# Patient Record
Sex: Female | Born: 1955
Health system: Southern US, Community
[De-identification: ages and names within clinical notes are randomized; demographics above are authoritative.]

## PROBLEM LIST (undated history)

## (undated) DIAGNOSIS — R259 Unspecified abnormal involuntary movements: Secondary | ICD-10-CM

## (undated) DIAGNOSIS — Q625 Duplication of ureter: Secondary | ICD-10-CM

## (undated) DIAGNOSIS — D649 Anemia, unspecified: Secondary | ICD-10-CM

## (undated) DIAGNOSIS — I480 Paroxysmal atrial fibrillation: Secondary | ICD-10-CM

## (undated) DIAGNOSIS — R0789 Other chest pain: Secondary | ICD-10-CM

## (undated) DIAGNOSIS — K449 Diaphragmatic hernia without obstruction or gangrene: Secondary | ICD-10-CM

## (undated) DIAGNOSIS — N200 Calculus of kidney: Secondary | ICD-10-CM

## (undated) DIAGNOSIS — I10 Essential (primary) hypertension: Secondary | ICD-10-CM

## (undated) DIAGNOSIS — Z9889 Other specified postprocedural states: Secondary | ICD-10-CM

## (undated) DIAGNOSIS — K8050 Calculus of bile duct without cholangitis or cholecystitis without obstruction: Secondary | ICD-10-CM

## (undated) DIAGNOSIS — R1310 Dysphagia, unspecified: Secondary | ICD-10-CM

## (undated) DIAGNOSIS — F411 Generalized anxiety disorder: Secondary | ICD-10-CM

## (undated) DIAGNOSIS — C801 Malignant (primary) neoplasm, unspecified: Secondary | ICD-10-CM

## (undated) HISTORY — DX: Dysphagia, unspecified: R13.10

## (undated) HISTORY — DX: Unspecified abnormal involuntary movements: R25.9

## (undated) HISTORY — DX: Anemia, unspecified: D64.9

## (undated) HISTORY — DX: Malignant (primary) neoplasm, unspecified: C80.1

## (undated) HISTORY — DX: Generalized anxiety disorder: F41.1

## (undated) HISTORY — DX: Diaphragmatic hernia without obstruction or gangrene: K44.9

## (undated) HISTORY — DX: Other chest pain: R07.89

## (undated) HISTORY — DX: Calculus of kidney: N20.0

## (undated) HISTORY — DX: Essential (primary) hypertension: I10

## (undated) HISTORY — DX: Calculus of bile duct without cholangitis or cholecystitis without obstruction: K80.50

## (undated) HISTORY — DX: Paroxysmal atrial fibrillation: I48.0

## (undated) HISTORY — DX: Duplication of ureter: Q62.5

---

## 1998-11-15 ENCOUNTER — Encounter: Admission: RE | Admit: 1998-11-15 | Discharge: 1998-12-13 | Payer: Self-pay | Admitting: Family Medicine

## 1998-11-29 ENCOUNTER — Other Ambulatory Visit: Admission: RE | Admit: 1998-11-29 | Discharge: 1998-11-29 | Payer: Self-pay | Admitting: Radiology

## 1999-07-31 ENCOUNTER — Other Ambulatory Visit: Admission: RE | Admit: 1999-07-31 | Discharge: 1999-07-31 | Payer: Self-pay | Admitting: Obstetrics and Gynecology

## 2000-07-16 ENCOUNTER — Ambulatory Visit (HOSPITAL_COMMUNITY): Admission: RE | Admit: 2000-07-16 | Discharge: 2000-07-16 | Payer: Self-pay | Admitting: Family Medicine

## 2000-07-16 ENCOUNTER — Encounter: Payer: Self-pay | Admitting: Family Medicine

## 2000-10-17 ENCOUNTER — Other Ambulatory Visit: Admission: RE | Admit: 2000-10-17 | Discharge: 2000-10-17 | Payer: Self-pay | Admitting: Obstetrics and Gynecology

## 2001-09-03 DIAGNOSIS — N200 Calculus of kidney: Secondary | ICD-10-CM

## 2001-09-03 HISTORY — DX: Calculus of kidney: N20.0

## 2001-09-03 HISTORY — PX: CYSTOSCOPY/RETROGRADE/URETEROSCOPY/STONE EXTRACTION WITH BASKET: SHX5317

## 2001-12-09 ENCOUNTER — Other Ambulatory Visit: Admission: RE | Admit: 2001-12-09 | Discharge: 2001-12-09 | Payer: Self-pay | Admitting: Obstetrics and Gynecology

## 2002-01-14 ENCOUNTER — Ambulatory Visit (HOSPITAL_COMMUNITY): Admission: EM | Admit: 2002-01-14 | Discharge: 2002-01-14 | Payer: Self-pay | Admitting: Emergency Medicine

## 2002-01-18 ENCOUNTER — Emergency Department (HOSPITAL_COMMUNITY): Admission: EM | Admit: 2002-01-18 | Discharge: 2002-01-19 | Payer: Self-pay | Admitting: Emergency Medicine

## 2003-02-10 ENCOUNTER — Other Ambulatory Visit: Admission: RE | Admit: 2003-02-10 | Discharge: 2003-02-10 | Payer: Self-pay | Admitting: Obstetrics and Gynecology

## 2003-09-04 HISTORY — PX: SQUAMOUS CELL CARCINOMA EXCISION: SHX2433

## 2003-11-30 ENCOUNTER — Ambulatory Visit (HOSPITAL_COMMUNITY): Admission: RE | Admit: 2003-11-30 | Discharge: 2003-11-30 | Payer: Self-pay | Admitting: Plastic Surgery

## 2003-11-30 ENCOUNTER — Ambulatory Visit (HOSPITAL_BASED_OUTPATIENT_CLINIC_OR_DEPARTMENT_OTHER): Admission: RE | Admit: 2003-11-30 | Discharge: 2003-11-30 | Payer: Self-pay | Admitting: Plastic Surgery

## 2003-11-30 ENCOUNTER — Encounter (INDEPENDENT_AMBULATORY_CARE_PROVIDER_SITE_OTHER): Payer: Self-pay | Admitting: *Deleted

## 2004-03-14 ENCOUNTER — Ambulatory Visit (HOSPITAL_COMMUNITY): Admission: RE | Admit: 2004-03-14 | Discharge: 2004-03-14 | Payer: Self-pay | Admitting: Family Medicine

## 2004-04-07 ENCOUNTER — Other Ambulatory Visit: Admission: RE | Admit: 2004-04-07 | Discharge: 2004-04-07 | Payer: Self-pay | Admitting: Obstetrics and Gynecology

## 2005-04-24 ENCOUNTER — Ambulatory Visit (HOSPITAL_COMMUNITY): Admission: RE | Admit: 2005-04-24 | Discharge: 2005-04-24 | Payer: Self-pay | Admitting: Family Medicine

## 2005-08-15 ENCOUNTER — Encounter: Admission: RE | Admit: 2005-08-15 | Discharge: 2005-08-15 | Payer: Self-pay | Admitting: Family Medicine

## 2005-08-30 ENCOUNTER — Encounter: Admission: RE | Admit: 2005-08-30 | Discharge: 2005-08-30 | Payer: Self-pay | Admitting: Family Medicine

## 2005-09-18 ENCOUNTER — Encounter: Admission: RE | Admit: 2005-09-18 | Discharge: 2005-09-18 | Payer: Self-pay | Admitting: Family Medicine

## 2005-10-01 ENCOUNTER — Encounter: Admission: RE | Admit: 2005-10-01 | Discharge: 2005-10-01 | Payer: Self-pay | Admitting: Family Medicine

## 2006-01-18 ENCOUNTER — Other Ambulatory Visit: Admission: RE | Admit: 2006-01-18 | Discharge: 2006-01-18 | Payer: Self-pay | Admitting: Family Medicine

## 2006-01-18 ENCOUNTER — Encounter: Admission: RE | Admit: 2006-01-18 | Discharge: 2006-01-18 | Payer: Self-pay | Admitting: Family Medicine

## 2006-02-18 ENCOUNTER — Encounter: Admission: RE | Admit: 2006-02-18 | Discharge: 2006-02-18 | Payer: Self-pay | Admitting: Family Medicine

## 2006-04-19 ENCOUNTER — Ambulatory Visit: Payer: Self-pay | Admitting: Gastroenterology

## 2006-05-02 ENCOUNTER — Ambulatory Visit: Payer: Self-pay | Admitting: Gastroenterology

## 2006-08-28 ENCOUNTER — Encounter: Admission: RE | Admit: 2006-08-28 | Discharge: 2006-08-28 | Payer: Self-pay | Admitting: Family Medicine

## 2006-09-05 ENCOUNTER — Encounter: Admission: RE | Admit: 2006-09-05 | Discharge: 2006-09-05 | Payer: Self-pay | Admitting: Family Medicine

## 2007-02-25 ENCOUNTER — Ambulatory Visit (HOSPITAL_BASED_OUTPATIENT_CLINIC_OR_DEPARTMENT_OTHER): Admission: RE | Admit: 2007-02-25 | Discharge: 2007-02-25 | Payer: Self-pay | Admitting: Plastic Surgery

## 2007-02-25 ENCOUNTER — Encounter (INDEPENDENT_AMBULATORY_CARE_PROVIDER_SITE_OTHER): Payer: Self-pay | Admitting: Plastic Surgery

## 2007-03-27 ENCOUNTER — Other Ambulatory Visit: Admission: RE | Admit: 2007-03-27 | Discharge: 2007-03-27 | Payer: Self-pay | Admitting: Family Medicine

## 2007-03-29 ENCOUNTER — Emergency Department (HOSPITAL_COMMUNITY): Admission: EM | Admit: 2007-03-29 | Discharge: 2007-03-29 | Payer: Self-pay | Admitting: Emergency Medicine

## 2007-04-03 ENCOUNTER — Ambulatory Visit: Payer: Self-pay

## 2007-04-04 ENCOUNTER — Encounter: Admission: RE | Admit: 2007-04-04 | Discharge: 2007-04-04 | Payer: Self-pay | Admitting: Family Medicine

## 2007-06-06 ENCOUNTER — Emergency Department (HOSPITAL_COMMUNITY): Admission: EM | Admit: 2007-06-06 | Discharge: 2007-06-07 | Payer: Self-pay | Admitting: Emergency Medicine

## 2007-10-06 ENCOUNTER — Encounter: Admission: RE | Admit: 2007-10-06 | Discharge: 2007-10-06 | Payer: Self-pay | Admitting: Family Medicine

## 2007-12-05 ENCOUNTER — Encounter: Admission: RE | Admit: 2007-12-05 | Discharge: 2007-12-05 | Payer: Self-pay | Admitting: Family Medicine

## 2008-09-03 DIAGNOSIS — K449 Diaphragmatic hernia without obstruction or gangrene: Secondary | ICD-10-CM

## 2008-09-03 DIAGNOSIS — K8050 Calculus of bile duct without cholangitis or cholecystitis without obstruction: Secondary | ICD-10-CM

## 2008-09-03 HISTORY — PX: LAPAROSCOPIC PARAESOPHAGEAL HERNIA REPAIR: SHX6307

## 2008-09-03 HISTORY — PX: LAPAROSCOPIC NISSEN FUNDOPLICATION: SHX1932

## 2008-09-03 HISTORY — DX: Diaphragmatic hernia without obstruction or gangrene: K44.9

## 2008-09-03 HISTORY — PX: LAPAROSCOPIC CHOLECYSTECTOMY W/ CHOLANGIOGRAPHY: SUR757

## 2008-09-03 HISTORY — DX: Calculus of bile duct without cholangitis or cholecystitis without obstruction: K80.50

## 2008-10-25 ENCOUNTER — Encounter: Admission: RE | Admit: 2008-10-25 | Discharge: 2008-10-25 | Payer: Self-pay | Admitting: Family Medicine

## 2008-11-30 ENCOUNTER — Other Ambulatory Visit: Admission: RE | Admit: 2008-11-30 | Discharge: 2008-11-30 | Payer: Self-pay | Admitting: Family Medicine

## 2009-02-01 ENCOUNTER — Emergency Department (HOSPITAL_COMMUNITY): Admission: EM | Admit: 2009-02-01 | Discharge: 2009-02-02 | Payer: Self-pay | Admitting: Emergency Medicine

## 2009-02-23 ENCOUNTER — Ambulatory Visit (HOSPITAL_COMMUNITY): Admission: RE | Admit: 2009-02-23 | Discharge: 2009-02-23 | Payer: Self-pay | Admitting: Surgery

## 2009-03-10 ENCOUNTER — Encounter (INDEPENDENT_AMBULATORY_CARE_PROVIDER_SITE_OTHER): Payer: Self-pay | Admitting: Surgery

## 2009-03-12 ENCOUNTER — Inpatient Hospital Stay (HOSPITAL_COMMUNITY): Admission: AD | Admit: 2009-03-12 | Discharge: 2009-03-26 | Payer: Self-pay | Admitting: Surgery

## 2009-04-15 ENCOUNTER — Ambulatory Visit (HOSPITAL_COMMUNITY): Admission: RE | Admit: 2009-04-15 | Discharge: 2009-04-15 | Payer: Self-pay | Admitting: Gastroenterology

## 2009-04-22 ENCOUNTER — Encounter: Admission: RE | Admit: 2009-04-22 | Discharge: 2009-04-22 | Payer: Self-pay | Admitting: Surgery

## 2009-05-11 ENCOUNTER — Encounter: Admission: RE | Admit: 2009-05-11 | Discharge: 2009-05-11 | Payer: Self-pay | Admitting: Surgery

## 2009-06-13 ENCOUNTER — Encounter: Admission: RE | Admit: 2009-06-13 | Discharge: 2009-06-13 | Payer: Self-pay | Admitting: Surgery

## 2009-09-28 ENCOUNTER — Encounter: Admission: RE | Admit: 2009-09-28 | Discharge: 2009-09-28 | Payer: Self-pay | Admitting: Gastroenterology

## 2010-01-31 ENCOUNTER — Encounter: Admission: RE | Admit: 2010-01-31 | Discharge: 2010-01-31 | Payer: Self-pay | Admitting: Gastroenterology

## 2010-05-09 IMAGING — CT CT ABDOMEN W/ CM
2 of 5 series · 16 of 46 positions shown, 18 images · IV contrast (omnipaque)
Comparison: Plain films of [DATE], esophagram 03/11/2009

CT CHEST

CLINICAL DATA: Paraesophageal hiatal hernia, laparoscopic
cholecystectomy and hernia repair,  persistent ileus.  Rule out
small bowel obstruction or abscess.

CT CHEST, ABDOMEN AND PELVIS WITH CONTRAST
TECHNIQUE: Multidetector CT imaging of the chest, abdomen and
pelvis was performed following the standard protocol during bolus
administration of intravenous contrast.
Contrast: 100 ml Omnipaque 300

[Series 2: cap with st · axial · 0.73mm/px · z∈[-800,-204]mm · 13 of 134 slices shown, 15 images]
[im 8/134  soft-tissue]
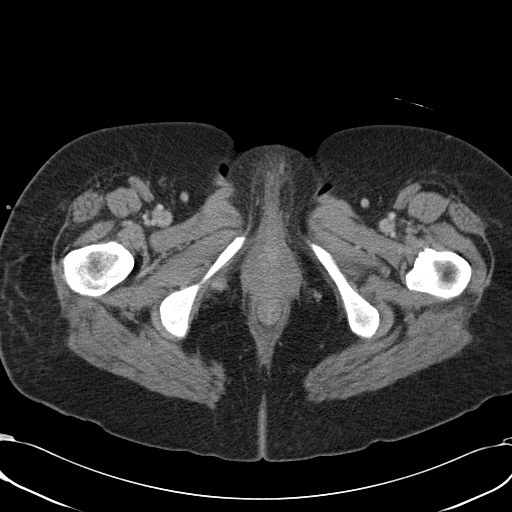
[im 8/134  bone]
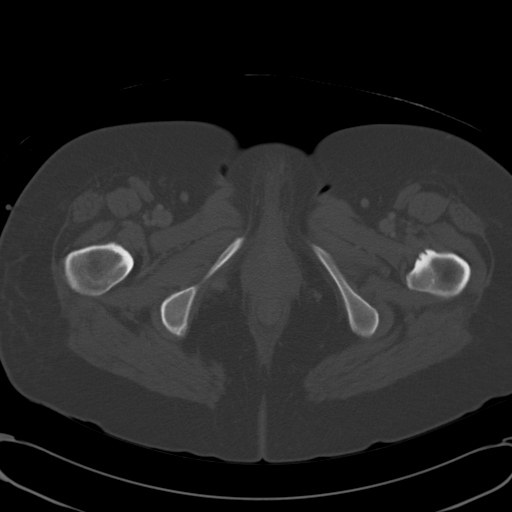
[im 22/134  soft-tissue]
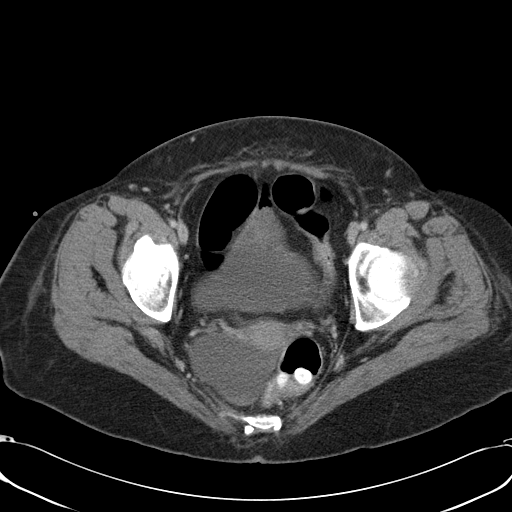
[im 29/134  soft-tissue]
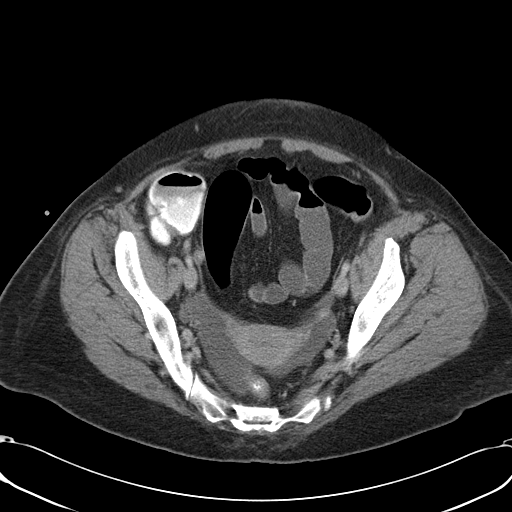
[im 36/134  soft-tissue]
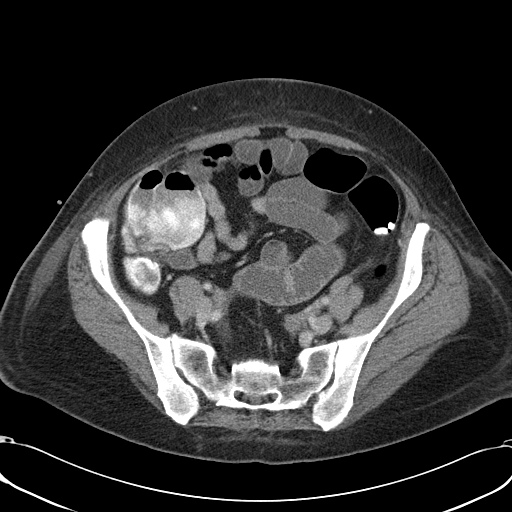
[im 50/134  soft-tissue]
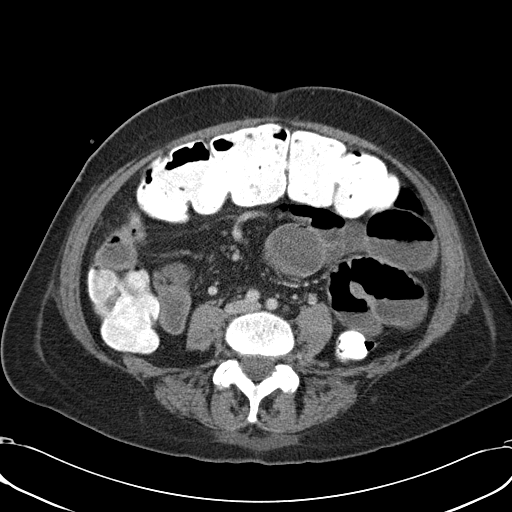
[im 57/134  soft-tissue]
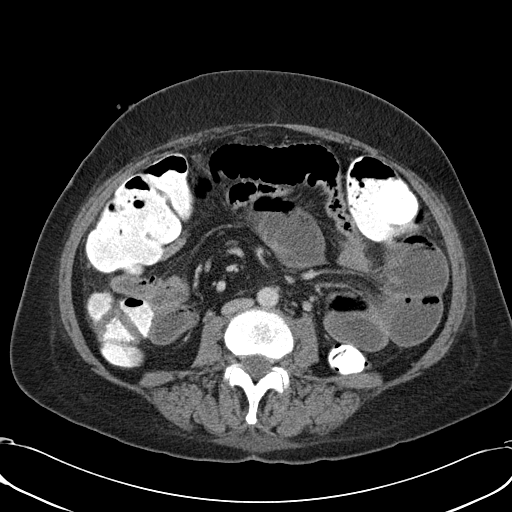
[im 71/134  soft-tissue]
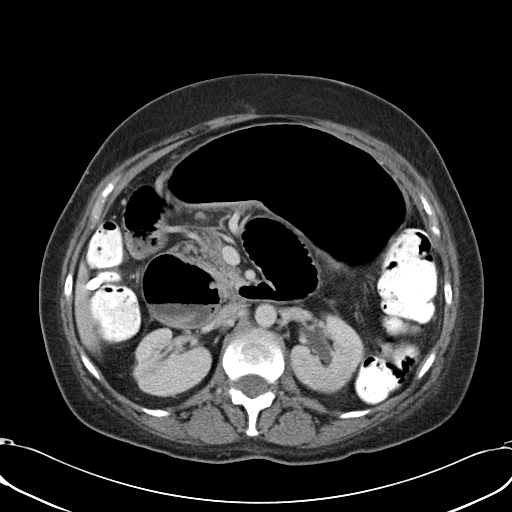
[im 78/134  soft-tissue]
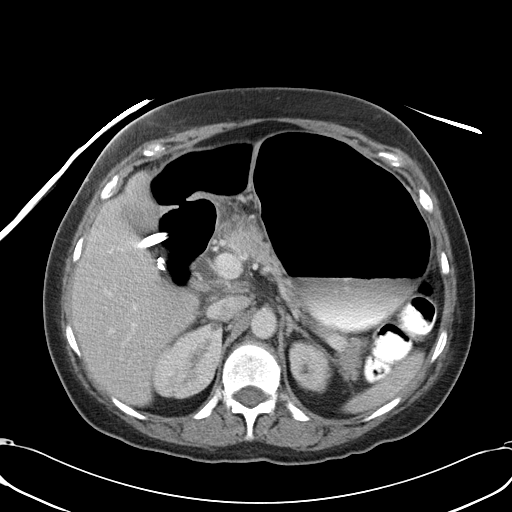
[im 85/134  soft-tissue]
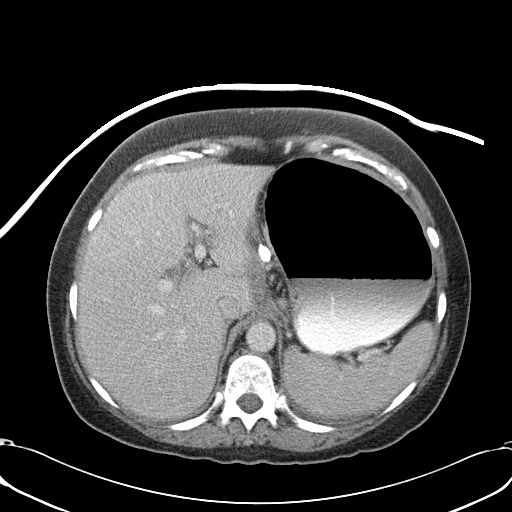
[im 85/134  bone]
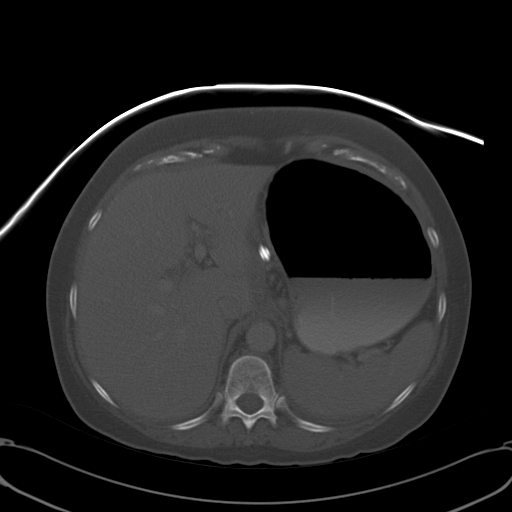
[im 99/134  soft-tissue]
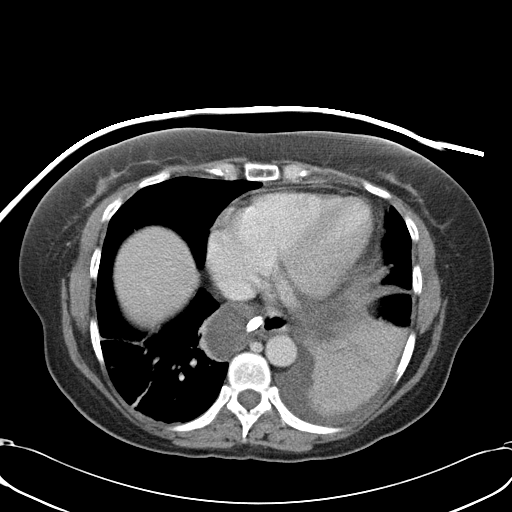
[im 106/134  soft-tissue]
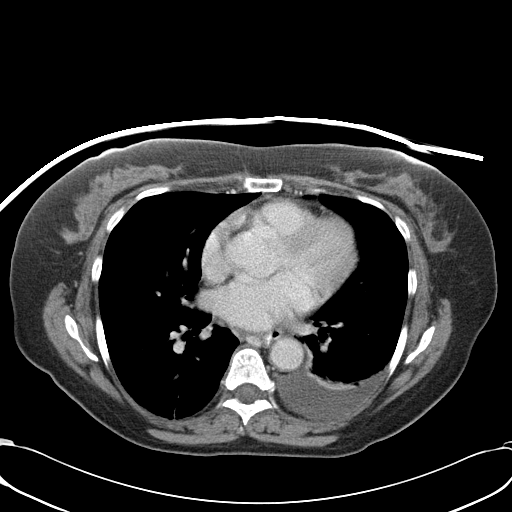
[im 113/134  soft-tissue]
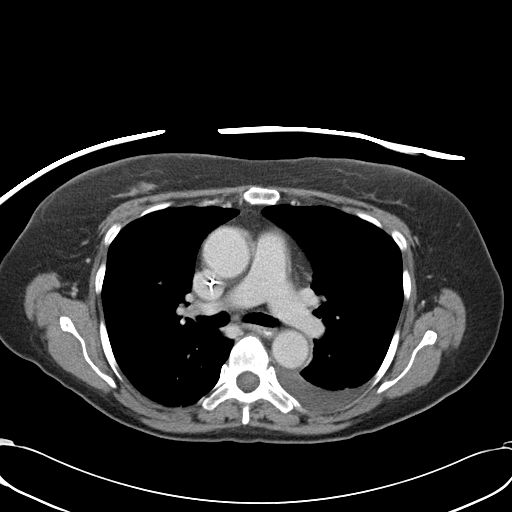
[im 127/134  soft-tissue]
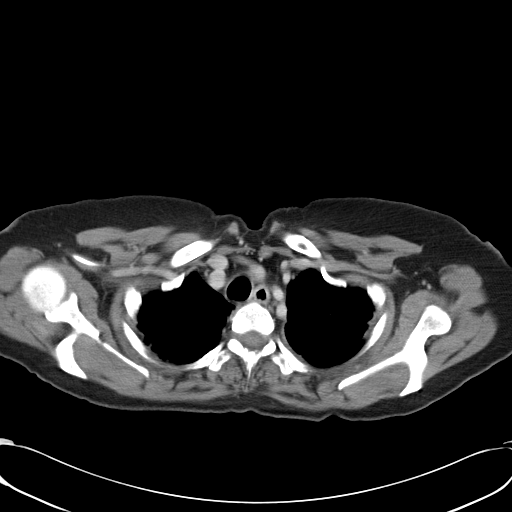

[Series 602: <mpr thick range> · coronal · 1.30mm/px · 3 of 88 slices shown]
[im 30/88  soft-tissue]
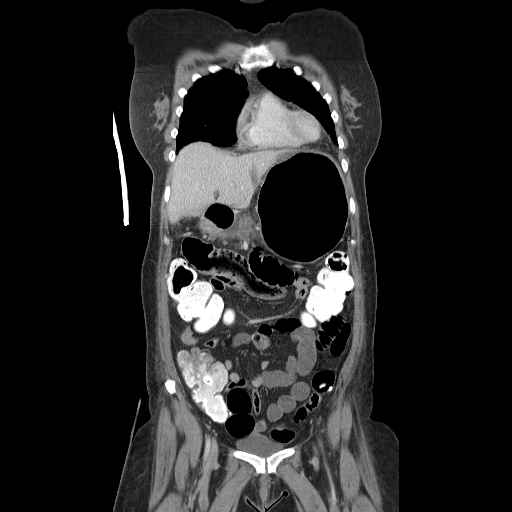
[im 39/88  soft-tissue]
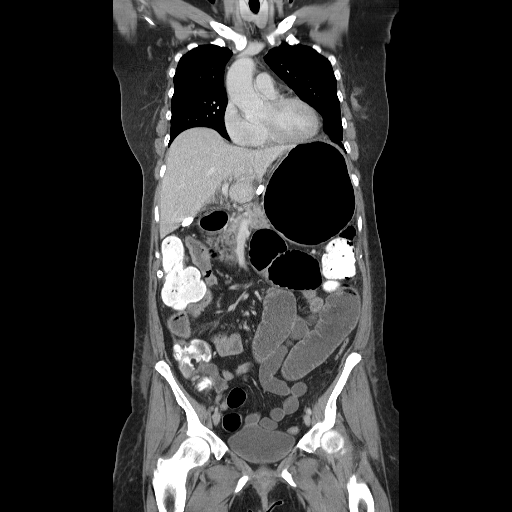
[im 49/88  soft-tissue]
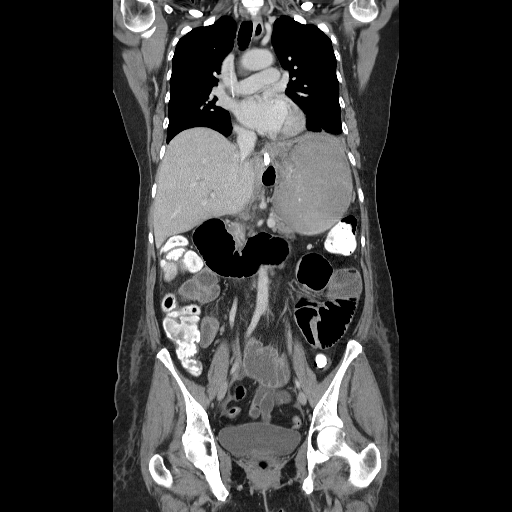

[16 of 46 positions shown; findings below may reference images not displayed]

FINDINGS: No chest wall abnormality.  There is a right PICC line
in place.  No evidence of axillary or supraclavicular
lymphadenopathy.  No evidence pericardial effusion.  No mediastinal
or hilar lymphadenopathy.

Review lung parenchyma demonstrates a moderate left  pleural
effusion with passive atelectasis of the left lower lobe.  There is
minimal atelectasis at the right lung base.  No evidence of focal
consolidation.  No pneumothorax.  Postsurgical change through the
GE junction consistent  with recent surgery. No gross evidence of
esophageal leak.
IMPRESSION: Left lower lobe atelectasis and effusion.

CT ABDOMEN
FINDINGS: There is a surgical drain entering from the right lower
quadrant and passing through the GE junction.  The stomach is
distended.    The patient drank a minimal volume of oral contrast.
There are dilated loops of proximal small bowel up to 4.8 cm.  The
more distal small bowel is collapsed.  No there is obvious
transition point.  No evidence of pneumatosis.  No intraperitoneal
free air.  No organized fluid collections within the abdomen.
There is retained high density contrast within colon related to
previous contrast exam.

The liver demonstrates no focal hepatic lesion.  There is no
evidence of organized fluid collection in the porta hepatis to
suggest biloma.  No intrahepatic biliary duct dilatation.  The
common bile duct is mildly dilated to 9 mm through the pancreatic
head.  The pancreas itself appears mildly edematous along the
margins of the genu.  The spleen and adrenal glands appear normal.
The kidneys appear normal.

Abdominal aorta is normal caliber.  No evidence of retroperitoneal
lymphadenopathy.
IMPRESSION: 1.  Findings most consistent with persistent  postop ileus versus
partial small bowel obstruction.  No transition point is
identified.
2.  Mild edema along the genu of the pancreas could represent focal
pancreatitis following surgery.

3.  No evidence of organized fluid collection to suggest abscess.
4.  No evidence of biloma.
5.  Surgical drain extends to the diaphragmatic hiatus.
Postsurgical change of hernia repair and fundoplication.  No
obvious leak of contrast.

CT PELVIS
FINDINGS: Smaller free fluid in the pelvis which is low density.
The bladder and uterus appear normal.  No evidence of pelvic
lymphadenopathy. Review of  bone windows demonstrates no aggressive
osseous lesions.
IMPRESSION: Small free fluid in the pelvis related to recent surgery.

## 2010-05-13 IMAGING — CR DG ABDOMEN ACUTE W/ 1V CHEST
3 series · 3 of 3 positions shown · non-contrast
Comparison: 03/22/2009

CLINICAL DATA: Follow up ileus

ACUTE ABDOMEN SERIES (ABDOMEN 2 VIEW & CHEST 1 VIEW)

[w chest pa]
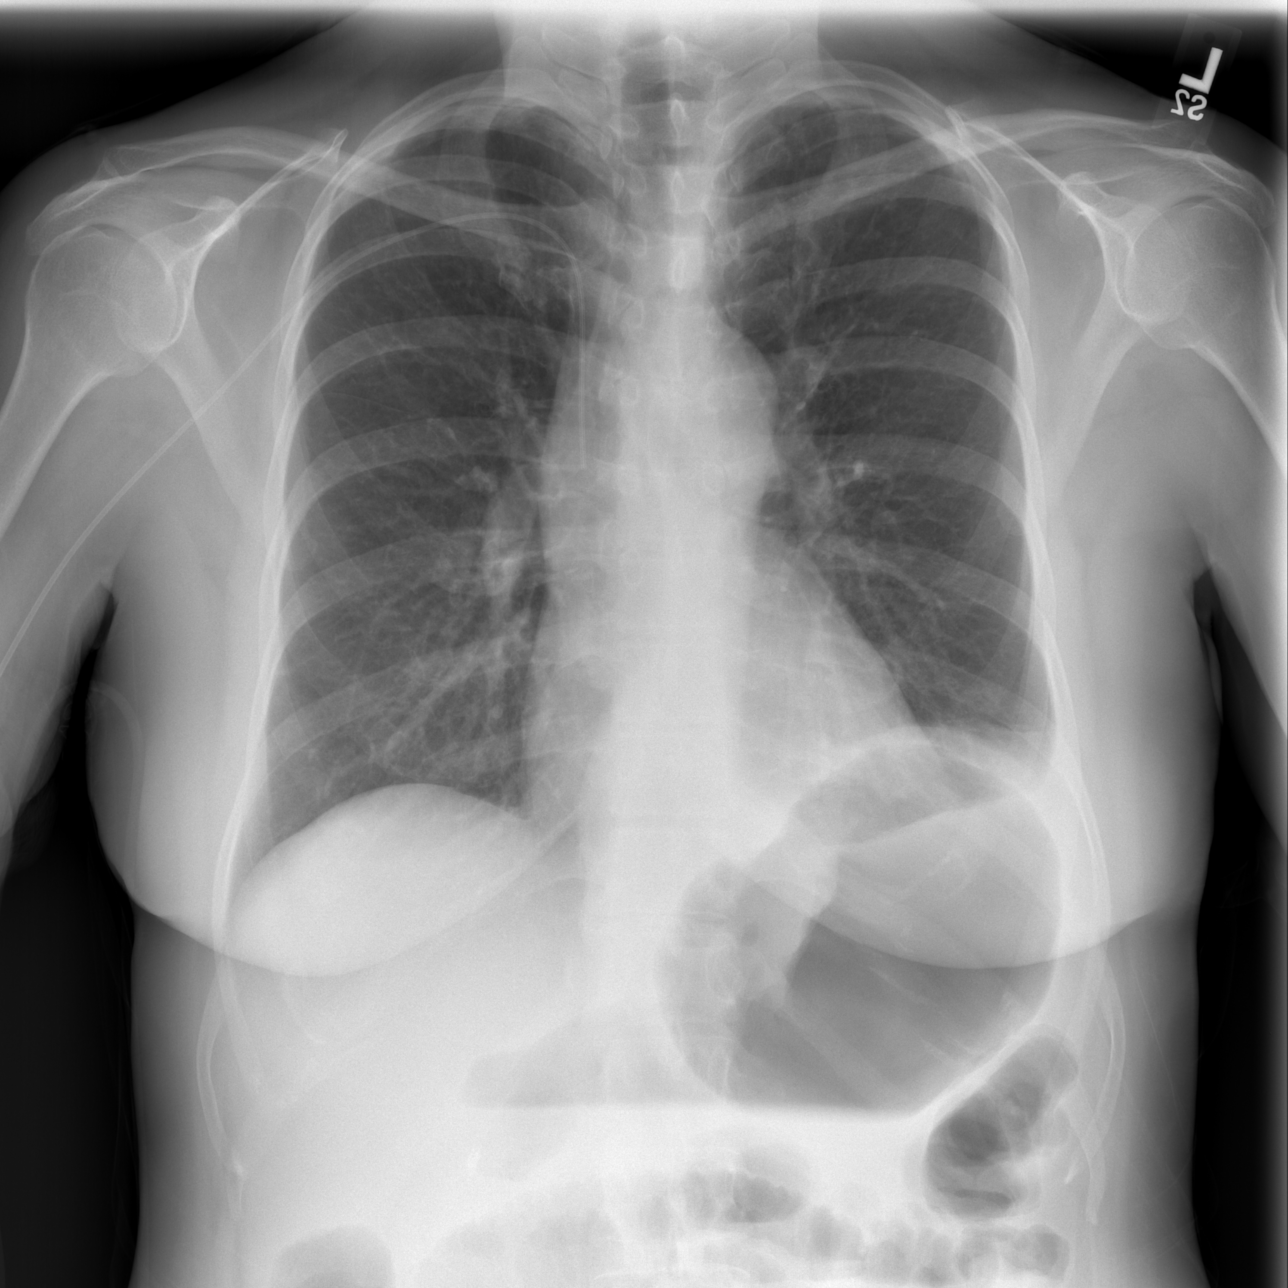

[w abdomen upright *]
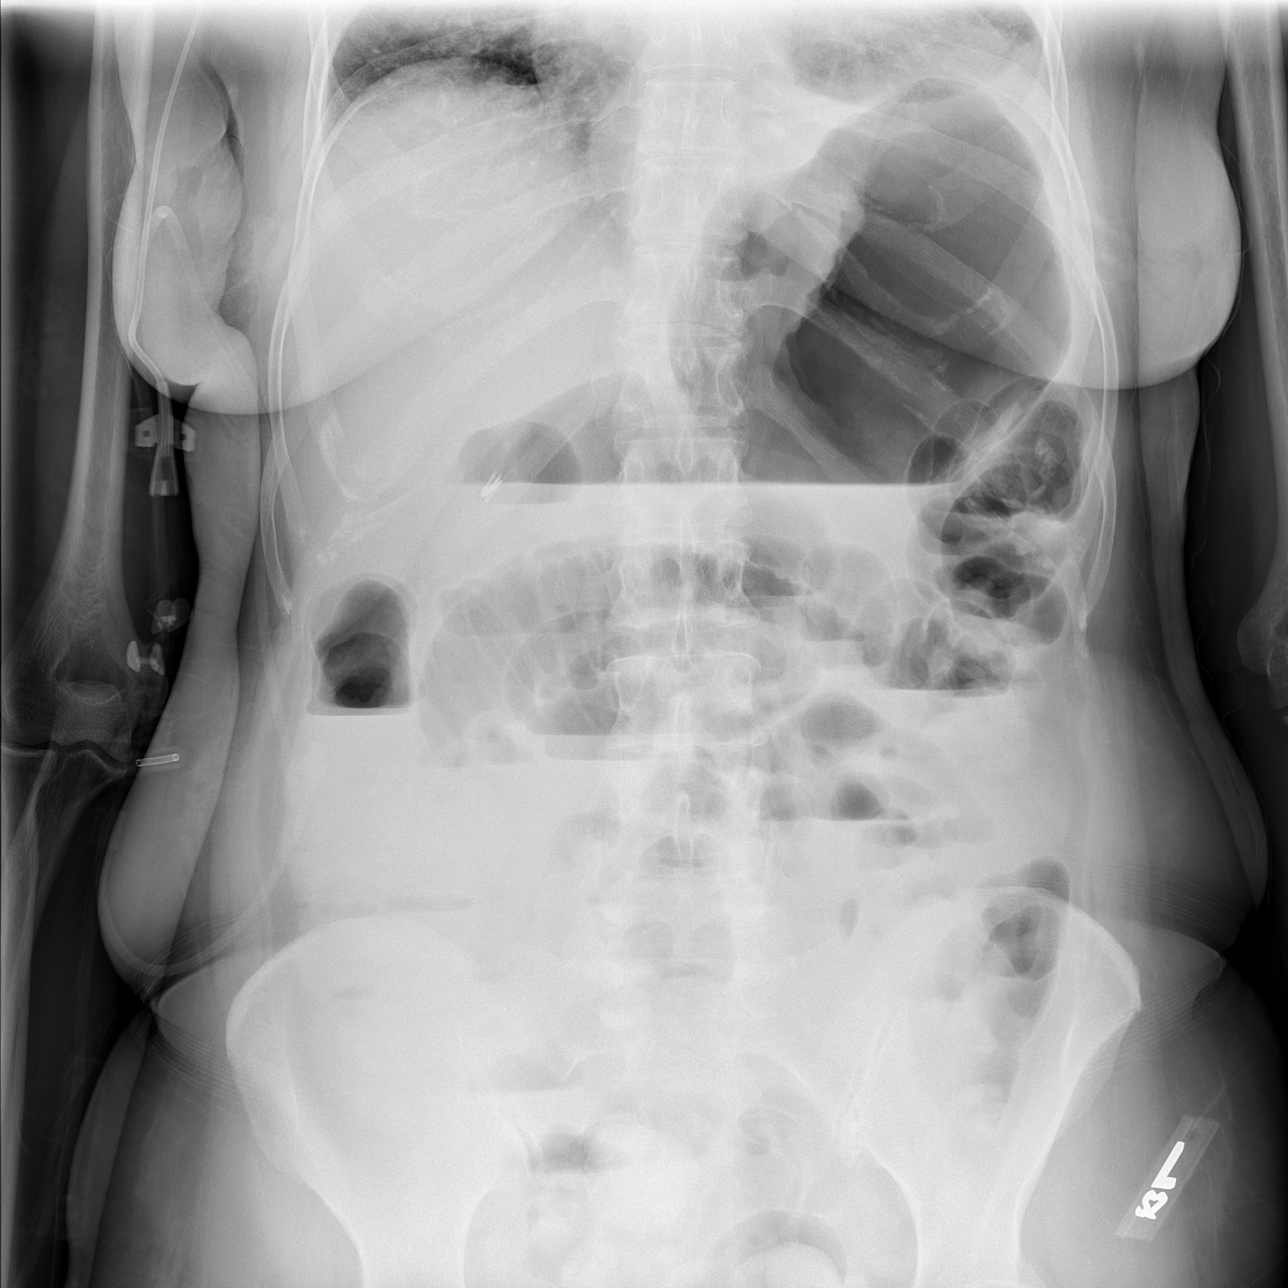

[t abdomen supine]
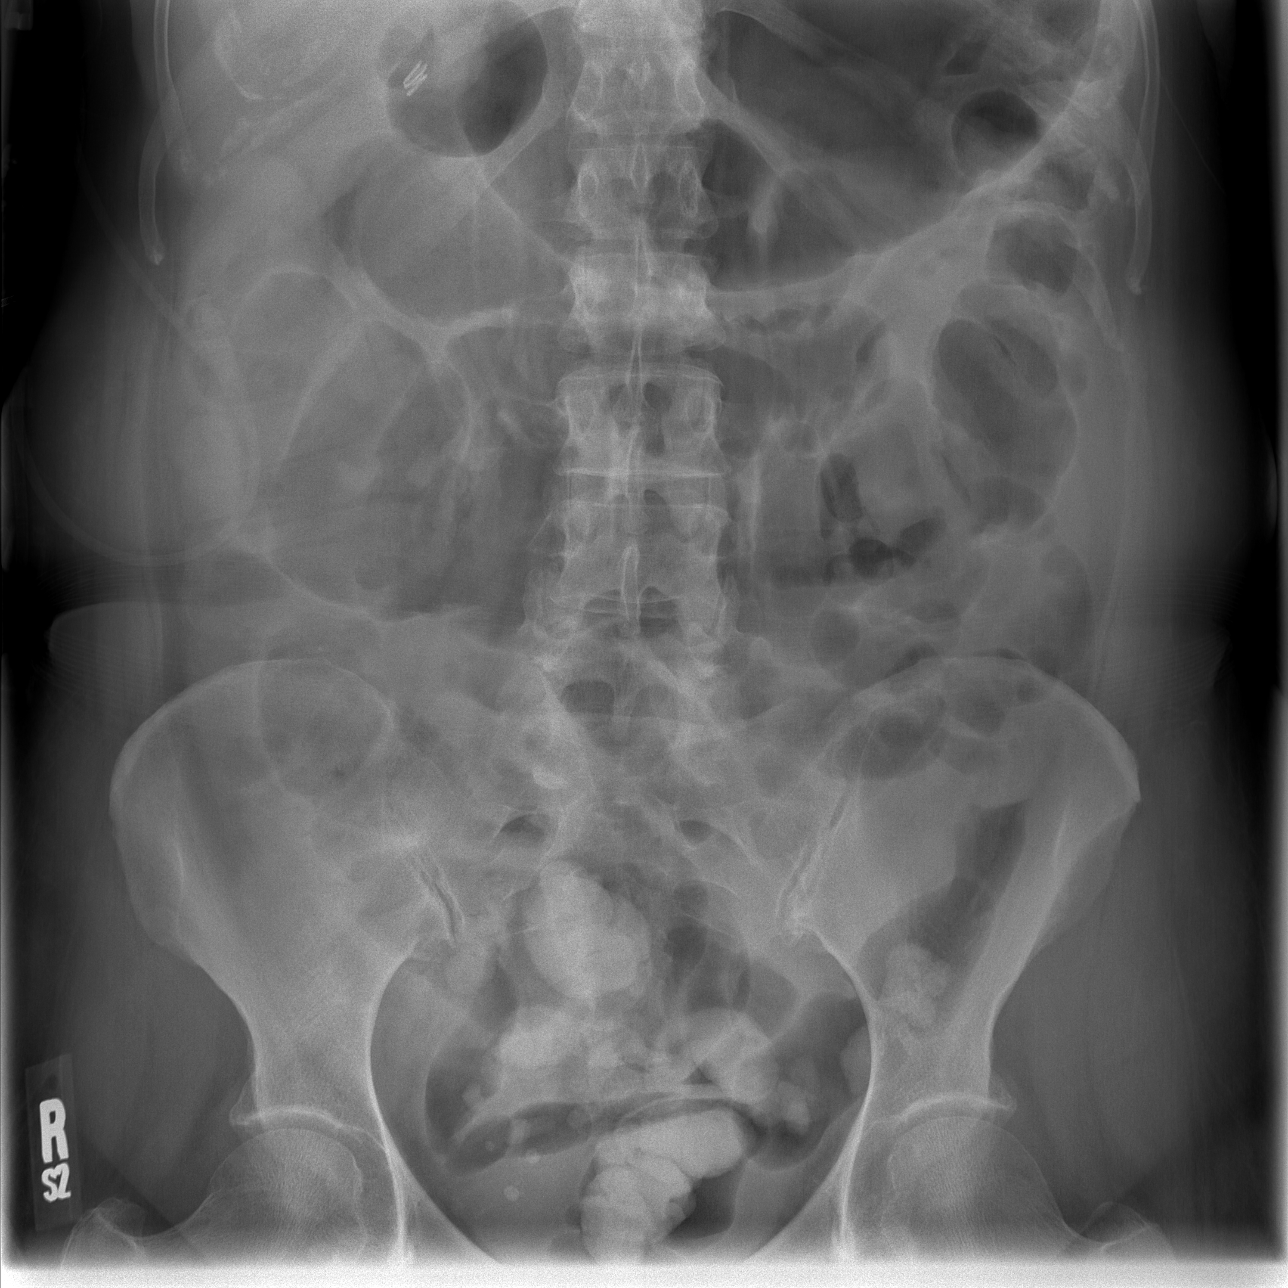

[3 of 3 positions shown; findings below may reference images not displayed]

FINDINGS: Cardiomediastinal silhouette is stable.  No acute
infiltrate or pleural effusion.  Right PICC line is unchanged in
position.  No pulmonary edema. Gastric distention with gas and
fluid again noted.  Persistent dilated small bowel loops in mid
abdomen with some air fluid levels suspicious for ileus or partial
small bowel obstruction.  Stool and air noted within nondilated
rectosigmoid colon.  Post cholecystectomy surgical clips are noted.
IMPRESSION: No acute disease within chest.  Persistent dilated small bowel
loops in mid abdomen with air-fluid levels suspicious for ileus or
partial small bowel obstruction.  Post cholecystectomy surgical
clips are noted.

## 2010-09-24 ENCOUNTER — Encounter: Payer: Self-pay | Admitting: Family Medicine

## 2010-10-09 ENCOUNTER — Other Ambulatory Visit (HOSPITAL_COMMUNITY)
Admission: RE | Admit: 2010-10-09 | Discharge: 2010-10-09 | Disposition: A | Discharge status: Home / Self Care | Payer: BC Managed Care – PPO | Source: Ambulatory Visit | Attending: Family Medicine | Admitting: Family Medicine

## 2010-10-09 ENCOUNTER — Other Ambulatory Visit: Payer: Self-pay | Admitting: Family Medicine

## 2010-10-09 DIAGNOSIS — Z0000 Encounter for general adult medical examination without abnormal findings: Secondary | ICD-10-CM | POA: Insufficient documentation

## 2010-10-12 ENCOUNTER — Other Ambulatory Visit: Payer: Self-pay | Admitting: Family Medicine

## 2010-10-12 DIAGNOSIS — Z1231 Encounter for screening mammogram for malignant neoplasm of breast: Secondary | ICD-10-CM

## 2010-11-06 ENCOUNTER — Ambulatory Visit: Payer: Self-pay

## 2010-12-10 LAB — COMPREHENSIVE METABOLIC PANEL
ALT: 24 U/L (ref 0–35)
ALT: 32 U/L (ref 0–35)
ALT: 86 U/L — ABNORMAL HIGH (ref 0–35)
ALT: 497 U/L — ABNORMAL HIGH (ref 0–35)
AST: 18 U/L (ref 0–37)
AST: 39 U/L — ABNORMAL HIGH (ref 0–37)
AST: 50 U/L — ABNORMAL HIGH (ref 0–37)
Albumin: 2.2 g/dL — ABNORMAL LOW (ref 3.5–5.2)
Albumin: 2.4 g/dL — ABNORMAL LOW (ref 3.5–5.2)
Albumin: 2.6 g/dL — ABNORMAL LOW (ref 3.5–5.2)
Albumin: 2.6 g/dL — ABNORMAL LOW (ref 3.5–5.2)
Alkaline Phosphatase: 111 U/L (ref 39–117)
Alkaline Phosphatase: 88 U/L (ref 39–117)
Alkaline Phosphatase: 190 U/L — ABNORMAL HIGH (ref 39–117)
Alkaline Phosphatase: 207 U/L — ABNORMAL HIGH (ref 39–117)
BUN: 13 mg/dL (ref 6–23)
BUN: 5 mg/dL — ABNORMAL LOW (ref 6–23)
BUN: 15 mg/dL (ref 6–23)
CO2: 25 mEq/L (ref 19–32)
CO2: 25 mEq/L (ref 19–32)
CO2: 26 mEq/L (ref 19–32)
CO2: 27 mEq/L (ref 19–32)
CO2: 25 mEq/L (ref 19–32)
Calcium: 8.2 mg/dL — ABNORMAL LOW (ref 8.4–10.5)
Chloride: 102 mEq/L (ref 96–112)
Chloride: 102 mEq/L (ref 96–112)
Chloride: 106 mEq/L (ref 96–112)
Chloride: 106 mEq/L (ref 96–112)
Chloride: 104 mEq/L (ref 96–112)
Creatinine, Ser: 0.5 mg/dL (ref 0.4–1.2)
Creatinine, Ser: 0.52 mg/dL (ref 0.4–1.2)
Creatinine, Ser: 0.76 mg/dL (ref 0.4–1.2)
Creatinine, Ser: 0.88 mg/dL (ref 0.4–1.2)
Creatinine, Ser: 0.89 mg/dL (ref 0.4–1.2)
GFR calc Af Amer: >60 mL/min (ref >60)
GFR calc Af Amer: >60 mL/min (ref >60)
GFR calc Af Amer: >60 mL/min (ref >60)
GFR calc Af Amer: >60 mL/min (ref >60)
GFR calc non Af Amer: >60 mL/min (ref >60)
GFR calc non Af Amer: >60 mL/min (ref >60)
GFR calc non Af Amer: >60 mL/min (ref >60)
GFR calc non Af Amer: >60 mL/min (ref >60)
Glucose, Bld: 168 mg/dL — ABNORMAL HIGH (ref 70–99)
Potassium: 3.2 mEq/L — ABNORMAL LOW (ref 3.5–5.1)
Potassium: 4.2 mEq/L (ref 3.5–5.1)
Potassium: 4.9 mEq/L (ref 3.5–5.1)
Potassium: 3.7 mEq/L (ref 3.5–5.1)
Potassium: 3.8 mEq/L (ref 3.5–5.1)
Sodium: 135 mEq/L (ref 135–145)
Sodium: 140 mEq/L (ref 135–145)
Sodium: 140 mEq/L (ref 135–145)
Total Bilirubin: 0.3 mg/dL (ref 0.3–1.2)
Total Bilirubin: 0.3 mg/dL (ref 0.3–1.2)
Total Bilirubin: 0.4 mg/dL (ref 0.3–1.2)
Total Bilirubin: 0.6 mg/dL (ref 0.3–1.2)
Total Bilirubin: 3.7 mg/dL — ABNORMAL HIGH (ref 0.3–1.2)
Total Protein: 5.5 g/dL — ABNORMAL LOW (ref 6.0–8.3)
Total Protein: 5.6 g/dL — ABNORMAL LOW (ref 6.0–8.3)
Total Protein: 6.4 g/dL (ref 6.0–8.3)

## 2010-12-10 LAB — BASIC METABOLIC PANEL
BUN: 8 mg/dL (ref 6–23)
BUN: 13 mg/dL (ref 6–23)
CO2: 27 mEq/L (ref 19–32)
CO2: 29 mEq/L (ref 19–32)
Calcium: 8.3 mg/dL — ABNORMAL LOW (ref 8.4–10.5)
Calcium: 9.1 mg/dL (ref 8.4–10.5)
Chloride: 106 mEq/L (ref 96–112)
Creatinine, Ser: 0.51 mg/dL (ref 0.4–1.2)
GFR calc Af Amer: >60 mL/min (ref >60)
GFR calc non Af Amer: >60 mL/min (ref >60)
GFR calc non Af Amer: >60 mL/min (ref >60)
Glucose, Bld: 111 mg/dL — ABNORMAL HIGH (ref 70–99)
Sodium: 136 mEq/L (ref 135–145)
Sodium: 142 mEq/L (ref 135–145)

## 2010-12-10 LAB — GLUCOSE, CAPILLARY
Glucose-Capillary: 110 mg/dL — ABNORMAL HIGH (ref 70–99)
Glucose-Capillary: 112 mg/dL — ABNORMAL HIGH (ref 70–99)
Glucose-Capillary: 114 mg/dL — ABNORMAL HIGH (ref 70–99)
Glucose-Capillary: 118 mg/dL — ABNORMAL HIGH (ref 70–99)
Glucose-Capillary: 123 mg/dL — ABNORMAL HIGH (ref 70–99)
Glucose-Capillary: 126 mg/dL — ABNORMAL HIGH (ref 70–99)
Glucose-Capillary: 127 mg/dL — ABNORMAL HIGH (ref 70–99)
Glucose-Capillary: 127 mg/dL — ABNORMAL HIGH (ref 70–99)
Glucose-Capillary: 131 mg/dL — ABNORMAL HIGH (ref 70–99)
Glucose-Capillary: 133 mg/dL — ABNORMAL HIGH (ref 70–99)
Glucose-Capillary: 140 mg/dL — ABNORMAL HIGH (ref 70–99)
Glucose-Capillary: 141 mg/dL — ABNORMAL HIGH (ref 70–99)
Glucose-Capillary: 149 mg/dL — ABNORMAL HIGH (ref 70–99)

## 2010-12-10 LAB — HEPATIC FUNCTION PANEL
ALT: 141 U/L — ABNORMAL HIGH (ref 0–35)
ALT: 443 U/L — ABNORMAL HIGH (ref 0–35)
ALT: 476 U/L — ABNORMAL HIGH (ref 0–35)
AST: 27 U/L (ref 0–37)
AST: 301 U/L — ABNORMAL HIGH (ref 0–37)
AST: 55 U/L — ABNORMAL HIGH (ref 0–37)
Albumin: 2.2 g/dL — ABNORMAL LOW (ref 3.5–5.2)
Albumin: 3.3 g/dL — ABNORMAL LOW (ref 3.5–5.2)
Albumin: 3.5 g/dL (ref 3.5–5.2)
Alkaline Phosphatase: 112 U/L (ref 39–117)
Alkaline Phosphatase: 133 U/L — ABNORMAL HIGH (ref 39–117)
Alkaline Phosphatase: 73 U/L (ref 39–117)
Bilirubin, Direct: 0.2 mg/dL (ref 0.0–0.3)
Bilirubin, Direct: 0.3 mg/dL (ref 0.0–0.3)
Bilirubin, Direct: 0.6 mg/dL — ABNORMAL HIGH (ref 0.0–0.3)
Indirect Bilirubin: 0.7 mg/dL (ref 0.3–0.9)
Indirect Bilirubin: 0.8 mg/dL (ref 0.3–0.9)
Total Bilirubin: 0.6 mg/dL (ref 0.3–1.2)
Total Bilirubin: 1 mg/dL (ref 0.3–1.2)
Total Bilirubin: 1.1 mg/dL (ref 0.3–1.2)
Total Bilirubin: 1.3 mg/dL — ABNORMAL HIGH (ref 0.3–1.2)
Total Protein: 5.9 g/dL — ABNORMAL LOW (ref 6.0–8.3)
Total Protein: 6 g/dL (ref 6.0–8.3)

## 2010-12-10 LAB — CBC
HCT: 26.4 % — ABNORMAL LOW (ref 36.0–46.0)
HCT: 28.3 % — ABNORMAL LOW (ref 36.0–46.0)
HCT: 28.8 % — ABNORMAL LOW (ref 36.0–46.0)
HCT: 28.7 % — ABNORMAL LOW (ref 36.0–46.0)
HCT: 32.6 % — ABNORMAL LOW (ref 36.0–46.0)
HCT: 33.6 % — ABNORMAL LOW (ref 36.0–46.0)
HCT: 34.9 % — ABNORMAL LOW (ref 36.0–46.0)
Hemoglobin: 10.2 g/dL — ABNORMAL LOW (ref 12.0–15.0)
Hemoglobin: 8.8 g/dL — ABNORMAL LOW (ref 12.0–15.0)
Hemoglobin: 11.7 g/dL — ABNORMAL LOW (ref 12.0–15.0)
Hemoglobin: 9.6 g/dL — ABNORMAL LOW (ref 12.0–15.0)
Hemoglobin: 9.8 g/dL — ABNORMAL LOW (ref 12.0–15.0)
MCHC: 33 g/dL (ref 30.0–36.0)
MCHC: 33.2 g/dL (ref 30.0–36.0)
MCHC: 33.4 g/dL (ref 30.0–36.0)
MCHC: 33.7 g/dL (ref 30.0–36.0)
MCHC: 33.2 g/dL (ref 30.0–36.0)
MCHC: 33.5 g/dL (ref 30.0–36.0)
MCHC: 33.6 g/dL (ref 30.0–36.0)
MCV: 89.4 fL (ref 78.0–100.0)
MCV: 90.2 fL (ref 78.0–100.0)
MCV: 90.4 fL (ref 78.0–100.0)
MCV: 90 fL (ref 78.0–100.0)
MCV: 90.3 fL (ref 78.0–100.0)
Platelets: 316 10*3/uL (ref 150–400)
Platelets: 325 10*3/uL (ref 150–400)
Platelets: 216 10*3/uL (ref 150–400)
Platelets: 236 10*3/uL (ref 150–400)
RBC: 2.86 MIL/uL — ABNORMAL LOW (ref 3.87–5.11)
RBC: 2.95 MIL/uL — ABNORMAL LOW (ref 3.87–5.11)
RBC: 3.14 MIL/uL — ABNORMAL LOW (ref 3.87–5.11)
RBC: 3.4 MIL/uL — ABNORMAL LOW (ref 3.87–5.11)
RBC: 3.24 MIL/uL — ABNORMAL LOW (ref 3.87–5.11)
RBC: 3.61 MIL/uL — ABNORMAL LOW (ref 3.87–5.11)
RBC: 3.85 MIL/uL — ABNORMAL LOW (ref 3.87–5.11)
RDW: 14 % (ref 11.5–15.5)
RDW: 13.8 % (ref 11.5–15.5)
RDW: 13.9 % (ref 11.5–15.5)
RDW: 14.2 % (ref 11.5–15.5)
RDW: 14.3 % (ref 11.5–15.5)
WBC: 11.6 10*3/uL — ABNORMAL HIGH (ref 4.0–10.5)
WBC: 11.7 10*3/uL — ABNORMAL HIGH (ref 4.0–10.5)
WBC: 7.5 10*3/uL (ref 4.0–10.5)
WBC: 4.8 10*3/uL (ref 4.0–10.5)
WBC: 9.9 10*3/uL (ref 4.0–10.5)

## 2010-12-10 LAB — TRIGLYCERIDES: Triglycerides: 134 mg/dL (ref <150)

## 2010-12-10 LAB — DIFFERENTIAL
Basophils Absolute: 0.1 10*3/uL (ref 0.0–0.1)
Basophils Absolute: 0.1 10*3/uL (ref 0.0–0.1)
Lymphocytes Relative: 16 % (ref 12–46)
Lymphocytes Relative: 16 % (ref 12–46)
Monocytes Absolute: 0.9 10*3/uL (ref 0.1–1.0)
Neutro Abs: 8.6 10*3/uL — ABNORMAL HIGH (ref 1.7–7.7)
Neutro Abs: 8.7 10*3/uL — ABNORMAL HIGH (ref 1.7–7.7)
Neutrophils Relative %: 75 % (ref 43–77)

## 2010-12-10 LAB — CHOLESTEROL, TOTAL
Cholesterol: 123 mg/dL (ref 0–200)
Cholesterol: 124 mg/dL (ref 0–200)

## 2010-12-10 LAB — PHOSPHORUS: Phosphorus: 3.9 mg/dL (ref 2.3–4.6)

## 2010-12-10 LAB — LIPASE, BLOOD
Lipase: 39 U/L (ref 11–59)
Lipase: 365 U/L — ABNORMAL HIGH (ref 11–59)

## 2010-12-10 LAB — AMYLASE, BODY FLUID
Amylase, Fluid: 18 U/L
Amylase, Fluid: 14880 U/L

## 2010-12-10 LAB — LIPID PANEL
Cholesterol: 146 mg/dL (ref 0–200)
HDL: 39 mg/dL — ABNORMAL LOW (ref >39)
LDL Cholesterol: 89 mg/dL (ref 0–99)
Total CHOL/HDL Ratio: 3.7 RATIO

## 2010-12-10 LAB — PREALBUMIN: Prealbumin: 9.4 mg/dL — ABNORMAL LOW (ref 18.0–45.0)

## 2010-12-10 LAB — MAGNESIUM
Magnesium: 1.6 mg/dL (ref 1.5–2.5)
Magnesium: 2 mg/dL (ref 1.5–2.5)
Magnesium: 2 mg/dL (ref 1.5–2.5)

## 2010-12-10 LAB — CREATININE, SERUM
Creatinine, Ser: 0.91 mg/dL (ref 0.4–1.2)
Creatinine, Ser: 0.93 mg/dL (ref 0.4–1.2)
GFR calc Af Amer: >60 mL/min (ref >60)
GFR calc non Af Amer: >60 mL/min (ref >60)

## 2010-12-10 LAB — CARDIAC PANEL(CRET KIN+CKTOT+MB+TROPI)
CK, MB: 1.2 ng/mL (ref 0.3–4.0)
Relative Index: INVALID (ref 0.0–2.5)
Total CK: 35 U/L (ref 7–177)
Troponin I: 0.03 ng/mL (ref 0.00–0.06)

## 2010-12-11 LAB — CBC
Hemoglobin: 11.6 g/dL — ABNORMAL LOW (ref 12.0–15.0)
RDW: 12.9 % (ref 11.5–15.5)
WBC: 10.2 10*3/uL (ref 4.0–10.5)

## 2010-12-11 LAB — DIFFERENTIAL
Basophils Relative: 1 % (ref 0–1)
Eosinophils Absolute: 0 10*3/uL (ref 0.0–0.7)
Monocytes Absolute: 0.3 10*3/uL (ref 0.1–1.0)
Monocytes Relative: 3 % (ref 3–12)
Neutrophils Relative %: 85 % — ABNORMAL HIGH (ref 43–77)

## 2010-12-11 LAB — URINALYSIS, ROUTINE W REFLEX MICROSCOPIC
Bilirubin Urine: NEGATIVE
Glucose, UA: NEGATIVE mg/dL
Ketones, ur: 40 mg/dL — AB
Leukocytes, UA: NEGATIVE
pH: 7.5 (ref 5.0–8.0)

## 2010-12-11 LAB — COMPREHENSIVE METABOLIC PANEL
ALT: 32 U/L (ref 0–35)
Albumin: 4.1 g/dL (ref 3.5–5.2)
Alkaline Phosphatase: 75 U/L (ref 39–117)
Chloride: 104 mEq/L (ref 96–112)
Glucose, Bld: 107 mg/dL — ABNORMAL HIGH (ref 70–99)
Potassium: 3.6 mEq/L (ref 3.5–5.1)
Sodium: 138 mEq/L (ref 135–145)
Total Protein: 6.9 g/dL (ref 6.0–8.3)

## 2010-12-11 LAB — URINE MICROSCOPIC-ADD ON

## 2010-12-26 ENCOUNTER — Ambulatory Visit
Admission: RE | Admit: 2010-12-26 | Discharge: 2010-12-26 | Disposition: A | Discharge status: Home / Self Care | Payer: BC Managed Care – PPO | Source: Ambulatory Visit | Attending: Family Medicine | Admitting: Family Medicine

## 2010-12-26 DIAGNOSIS — Z1231 Encounter for screening mammogram for malignant neoplasm of breast: Secondary | ICD-10-CM

## 2011-01-16 NOTE — Consult Note (Signed)
NAMERASHAD, OBEID              ACCOUNT NO.:  [id number]   MEDICAL RECORD NO.:  [id number]          PATIENT TYPE:  OIB   LOCATION:  1539                         FACILITY:  [facility]   PHYSICIAN:  Jordan Hawks. Elnoria Howard, MD    DATE OF BIRTH:  [birth year]   DATE OF CONSULTATION:  03/14/2009  DATE OF DISCHARGE:                                 CONSULTATION   REASON FOR CONSULTATION:  Choledocholithiasis and gallstone  pancreatitis.   PRIMARY CARE Antonino Nienhuis:  Dr. Bradd Canary.   HISTORY OF PRESENT ILLNESS:  This is a 55 year old female with a past  medical history of gastroesophageal reflux disease, hiatal hernia, and  idiopathic proctitis who underwent a Nissen fundoplication and  paraesophageal repair, as well as cholecystectomy by Dr. Michaell Cowing on an  elective basis.  The procedures went without any difficulty.  During the  evaluation, it was noted that she had a very inflamed-appearing  gallbladder, and it was felt that she would benefit from  cholecystectomy.  This was performed.  Intraoperative cholangiogram did  reveal some several small retained CBD stones, but there is no  obstruction as contrast clearly entered into the small bowel.  The  transaminases were noted to be elevated postoperatively and it was  unknown if this was secondary to the cholecystectomy or contrast  injection during the intraoperative cholangiogram.  Over the following  days, the patient did have issues with having some postoperative ileus.  However, 1 day prior to this consultation, she had a significant amount  of abdominal pain where she says she did not feel right.  Subsequently,  NG tube was placed and this helped to decompress her abdomen.  The  repeat blood work did reveal that she had an elevation in her total  bilirubin to 3.7, but currently had dropped down.  The patient, overall,  is convalescing, and she does not have severe abdominal pain at this  time, although she still feels unwell.  On February 01, 2009, she  did present  to the emergency room with complaints of abdominal pain that did not  feel like her typical gastroesophageal reflux disease pain.  During that  evaluation, she was noted to have multiple gallstones, but there was no  elevation in her transaminases.  At that time, clinically, it was  associated with nausea and vomiting and low grade fever.  Prior to that  time, she denied having any issues with biliary colic, but since that ER  visit in June, she continued to have minor episodes of biliary colic.   PAST MEDICAL/SURGICAL HISTORY:  As stated above in the history of  present illness.   ALLERGIES:  No known drug allergies.   FAMILY HISTORY:  Noncontributory.   SOCIAL HISTORY:  Negative for alcohol, tobacco, or illicit drug use.   MEDICATIONS:  1. Bisacodyl 10 mg PR daily.  2. Reglan 10 mg IV q.6 hours.  3. Protonix 40 mg IV q.12 hours.  4. Zosyn 3.375 g IV q.8 hours.  5. Dilaudid 0.5 mg to 2 mg IV q.30 minutes p.r.n.  6. Zofran 4 mg IV q.6 hours.  7. Phenergan  6.25 to 25 mg IV q.6 hours p.r.n.   ALLERGIES:  No known drug allergies.   PHYSICAL EXAMINATION:  VITAL SIGNS:  Blood pressure is 153/91, heart  rate is 99, respirations 26, temperature 99.5.  GENERAL:  The patient is in no acute distress.  She does appear to be  fatigued.  HEENT:  Normocephalic, atraumatic.  Extraocular muscles intact.  NECK:  Supple.  No lymphadenopathy.  LUNGS:  Clear to auscultation bilaterally.  CARDIOVASCULAR:  Regular rate and rhythm.  ABDOMEN:  Minimally distended, soft.  Decreased bowel sounds.  Some  tenderness along the surgical incisions on the right side.  No rebound,  rigidity.  EXTREMITIES:  No cyanosis, clubbing, or edema.   LABORATORY VALUES:  White blood cell count is 4.4, hemoglobin 11.3, MCV  is 90.7, platelets 216,000, sodium 142, potassium 2.8, chloride 105, CO2  27, glucose 98, BUN 15, creatinine 0.9, total bilirubin 1.3, alkaline  phosphatase 186, AST 191, ALT 390.   Albumin 2.6, lipase 130.   IMPRESSION:  1. Gall stone pancreatitis.  2. Choledocholithiasis.  3. Ileus postoperatively and/or from pancreatitis.  4. Status post paraesophageal hernia repair and Nissen fundoplication.  5. Status post laparoscopic cholecystectomy.   MEDICAL DECISION MAKING:  The patient apparently has a gallstone  pancreatitis attack at this time.  Her transaminases are decreasing now,  and it may be that she has passed a stone.  She was noted to have  multiple stones.  At this time, in light of her ileus, I am reluctant to  perform an ERCP or EUS at this time, as this will require insufflation.  Certainly, if the transaminases do start to increase up again, she will  require an ERCP, but if they are trending downwards, then it would be  prudent for her to convalesce from her surgical treatment and then to  perform further evaluation as an outpatient if possible.   PLAN:  1. At this time, to follow up liver panel and to perform the      procedures as described above if necessary during this admission.  2. To continue with NG tube suction.      Jordan Hawks Elnoria Howard, MD  Electronically Signed     PDH/MEDQ  D:  03/14/2009  T:  03/14/2009  Job:  [id number]   cc:   Quita Skye. Artis Flock, M.D.  Fax: [phone]   Ardeth Sportsman, MD  [address] [place] [address]

## 2011-01-16 NOTE — Op Note (Signed)
Leslie Taylor, CARTELLI NO.:  [id number]   MEDICAL RECORD NO.:  [id number]          PATIENT TYPE:  OIB   LOCATION:  0098                         FACILITY:  [facility]   PHYSICIAN:  Ardeth Sportsman, MD     DATE OF BIRTH:  10-Oct-1955   DATE OF PROCEDURE:  03/10/2009  DATE OF DISCHARGE:                               OPERATIVE REPORT   PRIMARY CARE PHYSICIAN:  Quita Skye. Artis Flock, M.D.   GASTROENTEROLOGIST:  Jordan Hawks. Elnoria Howard, MD   SURGEON:  Ardeth Sportsman, MD   ASSISTANT:  Wilmon Arms. Tsuei, M.D.   PREOPERATIVE DIAGNOSIS:  1. Paraesophageal hiatal hernia.  2. Gastroesophageal reflux disease.  3. Symptomatic cholecystolithiasis.  4. Possible chronic cholecystitis.   POSTOPERATIVE DIAGNOSIS:  1. Paraesophageal hiatal hernia.  2. Gastroesophageal reflux disease.  3. Acute on chronic cholecystitis.  4. Nonobstructing choledocholithiasis.   OPERATION:  1. Laparoscopic paraesophageal hiatal hernia repair with 8 x 16 cm      biologic mesh.  2. Laparoscopic Nissen fundoplication over a 72 French Bougie x2 cm.  3. Laparoscopic cholecystectomy with intraoperative cholangiogram.   ANESTHESIA:  General endotracheal anesthesia.  Local anesthetic and field block.   SPECIMENS:  Gallbladder.   DRAINS:  19 [place] Blake drain that comes out the right upper quadrant,  resting along the porta hepatis, along the lesser curvature of the  stomach and up into the anterior mediastinum.   ESTIMATED BLOOD LOSS:  250 mL.   COMPLICATIONS:  No major complications.   INDICATIONS:  Leslie Taylor is a pleasant 55 year old female who  struggled with intermittent episodes of severe chest pain and heartburn  and reflux for the past 4 years.  She has had more intense episodes of  pain with a negative cardiopulmonary workup and evidence of a large  paraesophageal hernia.  She has had positive pH probe study.  She had an  ultrasound which did show gallstones but they did not seem particularly  thickened at the time.  However, in discussion with her she does have  episodes of some occasional right upper quadrant pain with emesis as  well which was different.  She has occasionally had some sublingual  nitroglycerin which seemed to help and has had a negative echocardiogram  and cardiac workup.  Because of these persistent symptoms, evaluation  was concerning for paraesophageal hernia.  She also had concerns for a  biliary colic, systems more constant the last 48 hours.   The anatomy and physiology of esophagogastric function and foregut  function were discussed.  The pathophysiology of paraesophageal hiatal  herniation with nausea, vomiting, Cameron ulcers, pain, bleeding, etc,  was discussed.  She had preoperative manometry which ruled out any  esophageal dysmotility disorder.  She also had a pretty good story for  biliary colic so hepatobiliary and pancreatic function was discussed and  pathophysiology of cholecystolithiasis was noted.   Options were discussed,  recommendations were made for laparoscopic  paraesophageal hernia repair.  Given the moderate size and relatively  young age, recommended biologic mesh reinforcement to decrease risk of  recurrence.  I also recommended cholecystectomy as well.  Risks,  benefits and alternatives were discussed.  Questions were answered and  she and her husband agreed to proceed.   OPERATIVE FINDINGS:  She had a moderate sized paraesophageal hernia  which was 10 x 8 cm in size with about half of her stomach up in her  chest.  Her esophagus was not particularly foreshortened.   She had a very thickened gallbladder wall with white bile, concerning  for acute cholecystitis.  She also had a moderately dilated biliary  system with distal common bile duct stones that were not obstructing,  consistent with choledocholithiasis.   DESCRIPTION OF PROCEDURE:  Informed consent was confirmed.  The patient  received IV Cefoxitin prior to surgery.   She had sequential compression  devices activated during the entire case.  She underwent general  anesthesia without any difficulty.  She was positioned in a split length  table with arms tucked.  She had a Foley catheter sterilely placed.  Her  chest and abdomen were prepped and draped in a sterile fashion.   Entrance was gained into the abdomen through optical entry technique.  A  5-mm port was placed in the left paramedian region with the patient in a  head up and left side up position.  Camera inspection revealed no  intraabdominal injury.  Under direct visualization a 12-mm port was  placed in the subcostal ridge along the midclavicular line and 5-mm  ports were placed in the right flank and right mid abdomen as well.  A 5  mm port was placed in the left subxiphoid region, removed and an omega  shaped liver retractor was used to  lift the left lateral sector of the  liver anteriorly and expose the enlarged esophageal hiatus.  It was  secured to the bedpost.  Another 5-mm port was placed in the right  subxiphoid region.   Attention was turned to the paraesophageal hiatal hernia repair.  The  apex of the crura was found and entered into the anterior mediastinal  sac at this point.  Using careful blunt and ultrasound dissection, I was  able to free the anterior mediastinal hernia sac off both right and left  crura anteriorly and follow it up into the mid mediastinum and free it  off anteriorly and laterally.  With that and peeling it off, the suction  cup effect was released and the stomach could be reduced into the  abdomen.  Further dissection was done to free the phrenoesophageal  attachments on the medial right crus all the way down to the base and  follow that over to the left side as well.   The short gastric vessels were ligated using ultrasonic dissection along  the mid greater curvature of the stomach and follow it up over the body  and towards the cardia.  I was able to  roll away and then free it off  the medial left crus.  A type 2 mediastinal dissection was done to go  way up into the mediastinum to help get adequate anterior esophageal  length.  The anterior vagus had some wispy strands within it and it was  not one solid space but I was able to preserve one dominant strand.  The  patient's posterior vagus could easily be seen and was intact in one  structure and was preserved at all times.  The anterior mediastinal sac  was freed off using ultrasonic dissection taking care to avoid injury to  the anterior and posterior vagus nerves.  The posterior  sac and epiploic  appendages were freed off as well.   I was able to take the fundus of the stomach and bring it behind the  esophagus and just at the right side and wrap it and a classic shoe-  shine maneuver was done to set up the wrap.   The esophageal hiatus was closed using #0 Ethibond horizontal mattress  stitches, taking 1x2 cm pledgets with that on each side.  This was for 4  stitches since the hernia was larger than average.  This helped snug the  crura around the esophagus but making sure it was not overly tight.  An  8 x 16 cm pliable biologic Stratus mesh was chosen for mesh  reinforcement.  It was cut with a defect in it such that it had sort of  a lay transversely with the U splitting into tails of about one-third  and two-thirds in width.  It was rolled, placed in the abdomen and  unrolled.  It was placed on top of the crural closure behind and  posterior to the stomach.  It was secured to the diaphragm using  interrupted #0 Ethibond stitches on the superior medial tails near the  left and right apex individually and then as well further out laterally  on the right side as well as on the lateral left diaphragm into two  anterior spots.  The left posterolateral part of the mesh was allowed to  be tucked cephalad to the spleen to lay along the diaphragm.  A right  lateral stitch was placed on  some of the soft tissues anterior to the  inferior vena cava and portal structures, staying away from those and to  help lay the mesh well as well.   The wrap was re-set up and the posterior gastropexy was performed by  taking a bite of the posterior side of the right side of the stomach  wrap, then into the mesh, through the crural closure, out the mesh and  tied down with two interrupted stitches of #1 Ethibond.   A left anterior gastropexy was used taking a bite of the most superior  part of the left side of the wrap and up into the left superior apex  through the mesh as well and tied down.   Then, internal esophagogastric plication (Guarner) stitches were done  taking a bite of the inside of the left side of the wrap and a left  lateral esophagus and tied down.  A similar stitch was placed on the  right side in mirror image fashion.  A #56 French bougie was placed in  the esophagus under axial tension under direct visualization by  anesthesia.  A classic 3-stitch Nissen fundoplication was done using #0  Ethibond stitches, though one was a #1.  The stitches were measured and  it was a length of 2 cm.  The bougie was removed.  With removal of the  bougie, the crural closure was again snug but easily allowed a grasper  to pass through up into the mediastinum and also the mediastinum was  moved easily underneath the wrap, for nice sharp Nissen.  Copious  irrigation was done with clear return.  There was some tear on the  posterior surface of the left lateral sector of the liver which was  easily controlled with cautery.   Attention was turned to the cholecystectomy.  The greater omentum and  mesocolonic adhesions were moderate to the gallbladder.  It was a very  thickened gallbladder  wall and it was difficult to grasp.  Eventually we  grasped the fundus and elevated it cephalad  and freed off the greater  omentum and mesocolon off the gallbladder.  This was done with  ultrasound  dissection.  The peritoneal coverage between the liver and  posterolateral gallbladder wall were freed off half way off on the  gallbladder.  It was able to do dissection in mirror image fashion on  the anteromedial aspect.  With careful dissection, I was able to get  good dissection around the infundibulum and get a good critical view.  The anterior branch of the cystic artery could be easily seen and was  ligated with ultrasonic dissection.  Further dissection revealed a good  posterior branch as well and this was ligated as well. This just left  the cystic duct.  The inferior of the infundibulum was skeletonized, was  rather inflamed and thickened and had some dilated cystic duct artery  branch that did have some bleeding associated with that.  I was able to  skeletonize, isolate it and cauterize.  The lymphatics were dilated and  these were carefully skeletonized as well.   A clip was placed in the infundibulum and a partial cystic ductotomy was  performed.  A 5-French cholangiogram catheter was placed through a right  subcostal puncture site and into the cystic duct.  A cholangiogram was  run using dilate radiopaque contrast and continuous fluoroscopy.  Contrast flowed through a helical side branch consistent with cystic  duct cannulization.  The contrast refluxed into the common hepatic duct  and up the right and left intrahepatic chains.  Contrast easily came  down the common bile duct, across the ampulla into the duodenum.  Distally, there were a  lot of hyperlucent structures consistent with a  fair amount of common bile duct stones.  They were not obstructing.  I  re-ran it to be sure because it looked like one large clump but on the  second run it was more obvious that these were 2 to 5 mm shaped stones  and gravel.  I cannot get them to flush out.  The entire biliary system  was moderately dilated.   The cholangiogram catheter was removed.  Four clips were placed on the   cystic duct although the cystic duct was rather dilated.  The cystic  duct was transected at the infundibulum using the Harmonic scalpel. I  placed a #0 PDS Endoloop across the cystic duct stump for excellent  ligation.  The gallbladder was freed from its main attachments on the  liver bed and brought out the subcostal area having to open the fascia  up since it was very thickened and dilated.   Copious irrigation was done, hemostasis was ensured on the greater  omentum, mesocolon, and liver bed.  A #5 French drain was placed as  noted above.  The [facility] liver retractor was removed.  Copious  irrigation was done and hemostasis assured.  The capnoperitoneum was  evacuated.  The ports were removed.  The left subcostal fascial defect  was reapproximated with #0 Vicryl interrupted stitches.  The skin was  closed with a 4-0 Monocryl stitch.  Sterile dressing was applied.  The  drain was secured using a nylon stitch.  The patient was extubated and  sent to the recovery room in stable condition.   I explained the postoperative findings to the patient's family and care  as well. I will try to discuss the case with Dr. Luisa Hart  Hung to see  timing of ERCP as he just had a fundoplication but I feel the ERCP is  going to be needed.  If his liver function tests are not markedly  elevated, we can wait a little bit further before we go ahead and  proceed with that but I will discuss this with him.      Ardeth Sportsman, MD  Electronically Signed     SCG/MEDQ  D:  03/10/2009  T:  03/10/2009  Job:  [id number]   cc:   Jordan Hawks. Elnoria Howard, MD  Fax: [phone]   Quita Skye. Artis Flock, M.D.  Fax: [phone]

## 2011-01-16 NOTE — Discharge Summary (Signed)
Leslie Taylor, Leslie Taylor              ACCOUNT NO.:  [id number]   MEDICAL RECORD NO.:  [id number]          PATIENT TYPE:  INP   LOCATION:  1539                         FACILITY:  [facility]   PHYSICIAN:  Ardeth Sportsman, MD     DATE OF BIRTH:  February 05, 1956   DATE OF ADMISSION:  03/10/2009  DATE OF DISCHARGE:  03/26/2009                               DISCHARGE SUMMARY   PRIMARY CARE PHYSICIAN:  Dr. Bradd Canary.   GASTROENTEROLOGIST:  Dr. Jeani Hawking.   FINAL DISCHARGE DIAGNOSES:  1. Paraesophageal hiatal hernia with severe chest pain and reflux.  2. Other coequal discharge diagnosis is choledocholithiasis with      gallstone pancreatitis.  3. Other diagnoses include ileus secondary to choledocholithiasis.  4. Gastric bloat syndrome with moderate delay in gastric emptying.  5. Anxiety.  6. Mild sleep apnea.  7. Squamous cell cancer removed in the past.  8. 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  9. Essential tremor of the right hand.  10.Status post removal of Morton's neuroma.  11.Breast biopsy in the past.  12.Oral surgery with a tooth removal.   PROCEDURES PERFORMED:  Laparoscopic paraesophageal hiatal hernia repair  and cholecystectomy.  Laparoscopic Nissen fundoplication of 56 French  bougie.   HOSPITAL COURSE:  Ms. Hayes Rehfeldt is a 55 year old female with  problems noted above.  She underwent laparoscopic paraesophageal hiatal  hernia repair with Stratus mesh and Nissen fundoplication of her 56  French bougie.  I had done preoperative testing and she had some  moderate delayed gastric emptying, but less than 120 minutes.  She also  had evidence of cholecystitis as well and did a laparoscopic  cholecystectomy.  She was found to have choledocholithiasis.  This is  incidentally noted.  Postoperatively, she initially did well and had a  negative swallow study.   However, a few days after surgery, she had episodes of severe pain and  bloating, and had bumped up her liver enzymes  and amylase and developed  gallstone pancreatitis.  She had a profound ileus that lasted for some  time.  I consulted Dr. Elnoria Howard with GI on this on postoperative day 4.  NG  tube was placed and got several liters back.  Gradually, her liver  function tests normalized, so ERCP was held off.  I clamped the NG tube  after the output decreased, and it was removed on postoperative day 7.  I started some liquids and she struggled for the next week with  intermittent nausea and vomiting and bloating.  Did do follow up scans,  which showed some persistent pancreatic edema, but no new abscesses or  other abnormalities.   She finally started having some bowel movements and that seemed to  improve.  We worked with her to try and control her anxiolysis, as she  would often have severe contraction pains and discomfort.  By  postoperative day 16, she was walking the hallways relatively well and  she was having flatus and bowel movements.  Her liver function tests  were all normal.  She had some moderate anemia but not severe and it was  stable.  She had a negative cardiac workup.   Based on the improvement, she will be discharged home with the following  instructions.   DISCHARGE INSTRUCTIONS:  1. She should follow up with me within the next week to make sure she      continues to improve.  2. She should do Reglan and simethicone regularly to help decrease      bloating and other issues.  3. She should stick to a blenderized diet and not advance until her      dysphagia decreases and her bowels are working better.  4. She should call if she has worsening fevers, chills, sweats, nausea      or vomiting, pain or drainage from incisions, or other issues.  5. She had esophageal surgery and cholecystectomy instruction sheets,      discussed diet in detail and maintenance as well.  6. She should use an ice pack, heating pad, Tylenol p.r.n. pain.      Oxycodone p.r.n. pain.  7. Simethicone and Reglan  p.r.n. pain.  8. Phenergan p.r.n. nausea and Reglan p.r.n. nausea.      Ardeth Sportsman, MD  Electronically Signed     SCG/MEDQ  D:  04/05/2009  T:  04/05/2009  Job:  [id number]   cc:   Quita Skye. Artis Flock, M.D.  Fax: [phone]   Jordan Hawks. Elnoria Howard, MD  Fax: [phone]   Cassell Clement, M.D.  Fax: [phone]

## 2011-01-16 NOTE — Op Note (Signed)
Leslie Taylor, Leslie Taylor              ACCOUNT NO.:  [id number]   MEDICAL RECORD NO.:  [id number]          PATIENT TYPE:  AMB   LOCATION:  DSC                          FACILITY:  [facility]   PHYSICIAN:  Brantley Persons, M.D.DATE OF BIRTH:  1955-12-29   DATE OF PROCEDURE:  02/25/2007  DATE OF DISCHARGE:                               OPERATIVE REPORT   PREOPERATIVE DIAGNOSES:  1. Squamous cell carcinoma, right forearm (keratoacanthoma).  2. Squamous cell carcinoma in situ, left upper chest/clavicular area.   POSTOPERATIVE DIAGNOSES:  1. Squamous cell carcinoma, right forearm (keratoacanthoma).  2. Squamous cell carcinoma in situ, left upper chest/clavicular area.   PROCEDURE:  1. Wide local excision of 2.5-cm right forearm squamous cell carcinoma      with intraoperative frozen section diagnosis.  2. Complex closure of 8-cm right forearm incision.  3. Wide local excision of 1.3-cm left upper chest/clavicular squamous      cell carcinoma in situ with intraoperative frozen section      diagnosis.  4. Complex closure of 3.4-cm left upper chest/clavicular incision.   ATTENDING SURGEON:  Dr. Corrie Dandy Contogiannis.   ANESTHESIA:  IV sedation.   COMPLICATIONS:  None.   INDICATIONS FOR THE PROCEDURE:  The patient is a 55 year old Caucasian  female with biopsy-proven squamous cell carcinoma of the right forearm  as well as a squamous cell carcinoma in situ of the left upper  chest/clavicular area.  The patient presents to undergo excision of both  of these lesions.  We discussed with the patient that due to the size  and location of the squamous cell carcinoma on her right forearm we  would need to take about 2-mm circumferential margins.  This, of course,  will leave a significant wound to close, and so because of this, her  incision maybe a little long.  However, the patient accepts this.   PROCEDURE:  The patient was brought into the operating room and placed  on the table in the supine  position.  Adequate IV sedation was obtained,  and then the patient's left upper chest was prepped with Betadine and  draped in sterile fashion.  The right upper extremity also was  circumferentially prepped from above the elbow to below and draped in  sterile fashion.  I then proceeded with the excision of the right  forearm squamous cell carcinoma first.  The pathologist had out an  keratoacanthoma-type of possible reaction with it.  The skin and  subcutaneous tissues in the area of this injection as well as the  healing biopsy site in the left upper chest/clavicular area were  injected with the 1% lidocaine with epinephrine.  After adequate  hemostasis and anesthesia had taken effect, the procedure was begun.    Using loupe LAD magnification, the apparent normal skin was identified  to adjoin the squamous cell carcinoma.  The 2-mm circumferential margins  were marked around the squamous cell cancer.  This was then excised full  thickness through skin into the subcutaneous tissues.  The lesion was  marked at 12 o'clock position and passed off the table to undergo  intraoperative frozen section  diagnosis.  Dr. Berneta Levins was the  pathologist on duty, and she read the specimen.  She felt that all of  the squamous cell carcinoma had been removed.  I then proceeded with  closure of the specimen.  The skin and soft tissues were widely  undermined to allow closure of the tissues.  The deep dermal and dermal  layer were closed using 3-0 Monocryl suture.  The superficial dermal  layer was also closed with 3-0 Monocryl suture as appropriate.  The skin  was then closed with a 4-0 Monocryl in a running intracuticular stitch.  The incision was dressed with Benzoin and Steri-Strips.  There were no  complications.  The patient tolerated procedure well.  Total length of  right forearm incision closure was 8 cm.  Attention was then turned to  the left upper chest/clavicular area.  The 2-mm margins  were marked  circumferentially around the healing biopsy site.  The skin lesion was  then excised full thickness through the skin into the subcutaneous  tissues.  The specimen was marked at 12 o'clock position with a silk  suture, passed off the table to undergo intraoperative frozen section  diagnosis.  Dr. Berneta Levins, pathologist, reviewed the specimen while I  was on standby.  She felt that the squamous cell cancer was removed  except for the 9 o'clock margin which appeared to have a focal  involvement.  She therefore advised me to reexcised the 9 o'clock  margin.  I decided to reexcised the margin from the 7 o'clock position  to the 12 o'clock position, spanning through the 9 o'clock position to  include it properly.  After this second specimen was sent to the frozen  section laboratory, Dr. Clelia Croft felt good that all of the margins were free  of any cancer cells.  Total length of the excision was 1.3 cm.  I then  proceed with a complex closure of this incision.  The skin and  subcutaneous tissues were undermined for closure.  The subcutaneous  tissues and deep dermal layer were closed with 3-0 Monocryl suture.  The  superficial dermal layer was closed with 4-0 Monocryl suture.  The  cuticular layer was then also closed with 4-0 Monocryl suture.  There  were no complications.  The patient tolerated procedure well.  Both of  the areas of the skin lesion excision were then dressed with Benzoin and  Steri-Strips.  The patient was then awakened from IV sedation and taken  to recovery room in stable condition.  Both the patient and her husband  were given proper postoperative wound care instructions, and she was  discharged home in her husband's care in stable condition.  Follow-up  appointment will be tomorrow in the office.           ______________________________  Brantley Persons, M.D.    MC/MEDQ  D:  02/26/2007  T:  02/27/2007  Job:  [id number]   cc:   Brantley Persons, M.D.

## 2011-01-19 NOTE — Consult Note (Signed)
[facility]  Patient:    Leslie Taylor, Leslie Taylor Visit Number: [id number] MRN: [id number]          Service Type: EMS Location: ED Attending Physician:  Shelba Flake Dictated by:   Maretta Bees. Vonita Moss, M.D. Proc. Date: 01/14/02 Admit Date:  01/18/2002 Discharge Date: 01/19/2002   CC:         Earline Mayotte, M.D.   Consultation Report  HISTORY:  This 55 year old white female has had the onset of severe right abdominal and right lower quadrant pain.  She presented to the emergency room and a CT scan showed a 6 mm stone in the distal left ureter with significant obstruction.  She has never had a stone before.  She is finding the pain very difficult to tolerate.  She wished to have intervention.  There is no past history of stones.  She has had occasional UTIs before.  She is in general good health.  PAST SURGICAL HISTORY:  Mortons neuroma, nasal septoplasty.  ALLERGIES:  She says she gets severe stomach cramps with SHELLFISH.  MEDICATIONS:  Prozac.  SOCIAL HISTORY:  She does not smoke or drink alcohol.  FAMILY HISTORY:  Positive in that a nephew has had a stone.  PHYSICAL EXAMINATION  VITAL SIGNS:  As recorded.  GENERAL:  She is alert and oriented.  She is in distress.  SKIN:  Warm and dry.  LUNGS:  No respiratory distress.  HEART:  Tones are regular.  ABDOMEN:  Soft and nontender.  IMPRESSION: 1. Right ureteral calculus. 2. History of depression.  PLAN:  The patient wishes intervention and she was told about lithotripsy which would be available tomorrow but she wanted to go ahead with cystoscopy and ureteroscopy tonight and she was advised about the need for possible holmium laser therapy and double J catheter and risks of hemorrhage, infection, or perforation. Dictated by:   Maretta Bees. Vonita Moss, M.D. Attending Physician:  Shelba Flake DD:  01/14/02 TD:  01/18/02 Job: [id number]

## 2011-01-19 NOTE — Op Note (Signed)
NAME:  Leslie Taylor, Leslie Taylor                        ACCOUNT NO.:  [id number]   MEDICAL RECORD NO.:  [id number]                   PATIENT TYPE:  AMB   LOCATION:  DSC                                  FACILITY:  [facility]   PHYSICIAN:  Brantley Persons, M.D.             DATE OF BIRTH:  June 02, 1956   DATE OF PROCEDURE:  11/30/2003  DATE OF DISCHARGE:                                 OPERATIVE REPORT   PREOPERATIVE DIAGNOSIS:  Keratoacanthoma, possible squamous cell carcinoma,  right lower leg.   POSTOPERATIVE DIAGNOSIS:  Keratoacanthoma, possible squamous cell carcinoma,  right lower leg.   OPERATION PERFORMED:  1. Excision of 2.6 cm keratoacanthoma, possible squamous cell carcinoma with     intraoperative frozen section diagnosis.  2. Complex closure of 14.0 right lower leg incision.   SURGEON:  Brantley Persons, M.D.   ANESTHESIA:  IV sedation along with 1% lidocaine with epinephrine and 0.5  Marcaine plain.   ANESTHESIOLOGIST:  Janetta Hora. Gelene Mink, M.D.   ESTIMATED BLOOD LOSS:  5 mL.   FLUIDS REPLACED:  1000 mL crystalloid.   COMPLICATIONS:  None.   INDICATIONS FOR PROCEDURE:  The patient is a 55 year old Caucasian female  who had a skin lesion biopsied on the right lower leg by Dr. Elmon Else.  The pathology specimen indicates that this is a keratoacanthoma, however, a  well differentiated squamous cell carcinoma can not be rules out.  She  therefore presents for excision of the lesion.   DESCRIPTION OF PROCEDURE:  The patient was brought to the operating room and  IV sedation was obtained.  The right lower leg was then prepped  circumferentially with Betadine and draped in sterile fashion.  Due to the  nature of the patient's pathology report, I felt about a 2 mm margin would  be appropriate circumferentially.  Using loupe magnification, the edges of  the skin lesion were identified and then the 2 mm margins were marked  circumferentially.  The skin and subcutaneous tissues  were then infiltrated  with 1% lidocaine with epinephrine.  After sedation and anesthesia had been  obtained, the procedure was begun.  The healing biopsy site skin lesion was  excised with the appropriate surgical margin, full thickness through the  skin with the knife.  The specimen was marked in the 12 o'clock position  with a silk suture and passed off the table to undergo an intraoperative  frozen section diagnosis.  Dr. Almyra Free, the pathologist, reviewed the specimen  while I was on standby.  She felt that all of the skin lesion had been  removed with clear surgical margins.  I then proceeded with closure of the  wound.  Significant wide undermining of the skin and subcutaneous tissues  was performed underneath the superficial fascial layer.  Meticulous  hemostasis was obtained with a Bovie electrocautery.  The wound was then  closed in complex fashion.  Using 2-0 Ethibond suture, the subcutaneous  tissue  and superficial fascial layer was closed along with the deep dermis.  The dermal layer was then closed with 2-0 Monocryl suture.  The skin was  then closed with a 3-0 Monocryl running intracuticular stitch.  The incision  was dressed with bacitracin ointment followed by Xeroform, 4 x 4 gauze and  an Ace bandage.  In closure of the wound, I was careful to taper the  incision nicely to have it lie flat and defatted tissues as necessary.  There were no complications.  The patient tolerated the procedure well.  She  was then awakened from the IV sedation and taken to the PACU in stable  condition.  The postoperative instructions were reviewed with the patient  and her husband and she was discharged to home in  his care in stable  condition.  Follow-up appointment will be tomorrow in the office.                                               Brantley Persons, M.D.    MC/MEDQ  D:  11/30/2003  T:  11/30/2003  Job:  [id number]

## 2011-01-19 NOTE — Op Note (Signed)
[facility]  Patient:    Leslie Taylor, Leslie Taylor Visit Number: [id number] MRN: [id number]          Service Type: EMS Location: ED Attending Physician:  Benny Lennert Dictated by:   Maretta Bees. Vonita Moss, M.D. Proc. Date: 01/14/02 Admit Date:  01/14/2002                             Operative Report  PREOPERATIVE DIAGNOSIS:  Distal right ureteral calculus.  POSTOPERATIVE DIAGNOSES:  Distal right ureteral calculus and urethral stricture.  PROCEDURE:  Cystoscopy and urethral dilation, right retrograde pyelogram with interpretation, right ureteroscopy and ureteroscopic stone basketing and insertion of right double J catheter.  SURGEON:  Maretta Bees. Vonita Moss, M.D.  ANESTHESIA:  General.  INDICATIONS FOR PROCEDURE:  This is a 55 year old lady whose had recurrent UTIs and bladder pressure and then today developed severe right flank pain. A CT scan in the ER was noted to have hydronephrosis and what was felt to be a 6 mm stone in the distal right ureter. Because of severe symptoms, she wanted something done about it tonight and she was brought to the OR.  DESCRIPTION OF PROCEDURE:  The patient was brought to the operating room, placed in lithotomy position after genitalia were prepped and draped in the usual fashion. The urethra was tight, she was dilated to a 30 [place] and then inserted a cystoscope. The right ureteral orifice was edematous and inflamed. I passed a guidewire up this orifice and on fluoroscopy saw what I thought was a 6 mm stone and what it turned out to be was a phlebolith because it was no question outside the ureter and distant from the guidewire as was another smaller calculus. I could not see a definite calculus that was in the ureter. A retrograde pyelogram was obtained and she had a very dilated ureter all the way down to the very distal ureter including a tortuous upper ureter and a pyelocaliceal system dilation that looked chronic  because of the upper ureteral kink. I then put the guidewire in place and inserted the short rigid ureteroscope and just outside the intramural ureter, there was a tight area that I could bypass so I inserted the ureteral dilating sheath without difficulty and then reinserted the ureteroscope and just outside the intramural ureter, I found a small 1 mm calculus that I was able to retrieve and later give to the patients husband. I then ureteroscoped the distal ureter up to the pelvic brim and saw no other stones. I could not get a multicoiled kwart catheter to be placed in good position in the ureter, so instead I used a 6 [place] 26 cm double J that did end up with a nice full coil at the UPJ and a full coil in the bladder, string brought out per urethra. The bladder was emptied and the patient was then taken to the recovery room in good condition. Dictated by:   Maretta Bees. Vonita Moss, M.D. Attending Physician:  Benny Lennert DD:  01/14/02 TD:  01/16/02 Job: [id number]

## 2011-06-18 LAB — DIFFERENTIAL
Basophils Relative: 0
Eosinophils Absolute: 0.2
Neutrophils Relative %: 68

## 2011-06-18 LAB — I-STAT 8, (EC8 V) (CONVERTED LAB)
Acid-Base Excess: 3 — ABNORMAL HIGH
Chloride: 104
Glucose, Bld: 105 — ABNORMAL HIGH
Potassium: 4.3
pH, Ven: 7.356 — ABNORMAL HIGH

## 2011-06-18 LAB — POCT CARDIAC MARKERS
Myoglobin, poc: 59.2
Operator id: 151321

## 2011-06-18 LAB — CBC
MCHC: 33.4
MCV: 90
Platelets: 268

## 2011-06-20 LAB — POCT HEMOGLOBIN-HEMACUE: Operator id: 128471

## 2011-10-19 ENCOUNTER — Other Ambulatory Visit: Payer: Self-pay | Admitting: Family Medicine

## 2011-10-19 DIAGNOSIS — N63 Unspecified lump in unspecified breast: Secondary | ICD-10-CM

## 2011-10-19 DIAGNOSIS — N644 Mastodynia: Secondary | ICD-10-CM

## 2011-10-29 ENCOUNTER — Other Ambulatory Visit: Payer: BC Managed Care – PPO

## 2013-04-22 ENCOUNTER — Ambulatory Visit (INDEPENDENT_AMBULATORY_CARE_PROVIDER_SITE_OTHER): Payer: 59 | Admitting: Surgery

## 2013-05-05 ENCOUNTER — Ambulatory Visit (INDEPENDENT_AMBULATORY_CARE_PROVIDER_SITE_OTHER): Payer: 59 | Admitting: Surgery

## 2013-05-26 ENCOUNTER — Encounter (INDEPENDENT_AMBULATORY_CARE_PROVIDER_SITE_OTHER): Payer: Self-pay | Admitting: Surgery

## 2013-05-26 ENCOUNTER — Ambulatory Visit (INDEPENDENT_AMBULATORY_CARE_PROVIDER_SITE_OTHER): Payer: 59 | Admitting: Surgery

## 2013-05-26 VITALS — BP 128/72 | HR 92 | Resp 20 | Ht 71.0 in | Wt 176.4 lb

## 2013-05-26 DIAGNOSIS — K3184 Gastroparesis: Secondary | ICD-10-CM | POA: Insufficient documentation

## 2013-05-26 DIAGNOSIS — G252 Other specified forms of tremor: Secondary | ICD-10-CM | POA: Insufficient documentation

## 2013-05-26 DIAGNOSIS — R1310 Dysphagia, unspecified: Secondary | ICD-10-CM

## 2013-05-26 DIAGNOSIS — R0789 Other chest pain: Secondary | ICD-10-CM

## 2013-05-26 DIAGNOSIS — R079 Chest pain, unspecified: Secondary | ICD-10-CM

## 2013-05-26 DIAGNOSIS — F411 Generalized anxiety disorder: Secondary | ICD-10-CM

## 2013-05-26 DIAGNOSIS — R109 Unspecified abdominal pain: Secondary | ICD-10-CM

## 2013-05-26 DIAGNOSIS — Q625 Duplication of ureter: Secondary | ICD-10-CM

## 2013-05-26 HISTORY — DX: Dysphagia, unspecified: R13.10

## 2013-05-26 HISTORY — DX: Other chest pain: R07.89

## 2013-05-26 HISTORY — DX: Duplication of ureter: Q62.5

## 2013-05-26 HISTORY — DX: Generalized anxiety disorder: F41.1

## 2013-05-26 HISTORY — DX: Other specified forms of tremor: G25.2

## 2013-05-26 MED ORDER — PROMETHAZINE HCL 12.5 MG PO TABS: 12.5000 mg | ORAL_TABLET | Freq: Four times a day (QID) | ORAL | Status: DC | PRN

## 2013-05-26 NOTE — Progress Notes (Signed)
Subjective:     Patient ID: Leslie Taylor, female   DOB: March 14, 1956, 57 y.o.   MRN: [id number]  HPI  NEITA LANDRIGAN  Feb 07, 1956 [id number]  Patient Care Team: Baldemar Lenis as PCP - General (Family Medicine) Theda Belfast, MD as Consulting Physician (Gastroenterology) Donato Schultz, MD as Consulting Physician (Cardiology)  This patient is a 57 y.o.female who presents today for surgical evaluation at the request of self.   Reason for visit: Recurrent chest and abdominal pain.  Pleasant woman who I met four years ago.  She had a moderate size paraesophageal hiatal hernia and symptomatic gallstones.  Was having bloating and chest pain.  Cardiac workup otherwise negative.   She had mild delayed gastric emptying but not severe.  She had normal manometry.  No evidence of any dysmotility or spasm are not cracker.  Normal peristalsis.  I performed a laparoscopic paraesophageal hiatal hernia repair over biologic mesh, Nissen fundoplication, and cholecystectomy.  She had bloating and an ileus initially.  She had some gallstone pancreatitis with choledocholithiasis.  Initially improved.  However had recurrent symptoms and require an ERCP.  Had some abnormalities on her liver suspicious for possible abscess vs. Irritation.  Placed on antibiotics.  Eventually improved.  Lost to followup for the past three years.  I believe her marriage was on the rocks at the time.  She had a lot of issues of anxiety and stress.  She tells me she would have some mild bloating and chest discomfort over the past two years but controllable.  About six months ago, she began to get severe episodes of chest pain.  Usually after eating.  Associated with severe nausea.  She has not been able to vomit since her surgery.  Occasional retching.  Ran out of nausea medicine years ago.  She has been taking 5 mg of Reglan with every meal.  That has been going on for years.   Dose not adjusted.  She denies any heartburn or reflux.   However, she claims she did not have much of that preop.  She gets some sticking dysphasia to pasta and steak.  She has lost weight but has gained it back.  (180lb in 2010, 176lb in office today). She notes heavy or fatty foods can give her some crampiness and diarrhea since her cholecystectomy.  Feels a lot of early satiety.  Some mild constipation.  She claims she can walk a mile or two without problems.  She usually does not get these attacks with walking.  Usually this was meals, but not always.  She has had cardiac dysrhythmia stable on the flecainide.  Only needed digoxin one time in the past few years.  She claims she saw her cardiologist recently and that was fine.  Because she has recurrent symptoms, she was worried the hiatal hernia may have come back.  She saw her for three years ago that questioned a possible hernia.  Does not sound like she has discussed this with her primary care physician or with her gastroenterologist.  She has rescheduled this appointment a few times already.  Patient Active Problem List   Diagnosis Date Noted  . 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 Anemia   . Cancer     skin  . Hypertension   . Nephrolithiasis 2003    removed w stent  . Paraesophageal hiatal hernia 2010    lap repair 2010  . Choledocholithiasis 2010    ERCP  Dr Elnoria Howard 04/2009    Past Surgical History  Procedure Laterality Date  . Laparoscopic paraesophageal hernia repair  2010  . Squamous cell carcinoma excision  2005  . Cystoscopy/retrograde/ureteroscopy/stone extraction with basket  2003    Cystoscopy and urethral dilation, right retrograde pyelogram with  . Laparoscopic cholecystectomy w/ cholangiography  2010    History   Social History  . Marital Status: Married    Spouse Name: N/A    Number of Children: N/A  . Years of Education: N/A   Occupational History  . Not on file.   Social History Main Topics  . Smoking status: Never Smoker   .  Smokeless tobacco: Never Used  . Alcohol Use: Yes     Comment: occ  . Drug Use: No  . Sexual Activity: Not on file   Other Topics Concern  . Not on file   Social History Narrative  . No narrative on file    Family History  Problem Relation Age of Onset  . Cancer Father     prostate    Current Outpatient Prescriptions  Medication Sig Dispense Refill  . aspirin 81 MG tablet Take 81 mg by mouth daily.      Marland Kitchen BYSTOLIC 5 MG tablet       . CALCIUM PO Take by mouth.      . flecainide (TAMBOCOR) 100 MG tablet       . metoCLOPramide (REGLAN) 5 MG tablet Take 5 mg by mouth 4 (four) times daily.      . Multiple Vitamin (MULTIVITAMIN) tablet Take 1 tablet by mouth daily.      . traMADol (ULTRAM) 50 MG tablet        No current facility-administered medications for this visit.     No Known Allergies  BP 128/72  Pulse 92  Resp 20  Ht 5\' 11"  (1.803 m)  Wt 176 lb 6.4 oz (80.015 kg)  BMI 24.61 kg/m2  No results found.   Review of Systems  Constitutional: Negative for fever, chills, diaphoresis, activity change, appetite change, fatigue and unexpected weight change.  HENT: Negative for ear pain, sore throat, trouble swallowing, neck pain and ear discharge.   Eyes: Negative for photophobia, discharge and visual disturbance.  Respiratory: Negative for cough, choking, chest tightness, shortness of breath, wheezing and stridor.   Cardiovascular: Positive for chest pain. Negative for leg swelling.  Gastrointestinal: Positive for nausea, abdominal pain and abdominal distention. Negative for vomiting, diarrhea, constipation, anal bleeding and rectal pain.  Endocrine: Negative for cold intolerance and heat intolerance.  Genitourinary: Negative for dysuria, frequency and difficulty urinating.  Musculoskeletal: Negative for myalgias and gait problem.  Skin: Negative for color change, pallor and rash.  Allergic/Immunologic: Negative for environmental allergies, food allergies and  immunocompromised state.  Neurological: Negative for dizziness, speech difficulty, weakness, numbness and headaches.  Hematological: Negative for adenopathy.  Psychiatric/Behavioral: Negative for confusion and agitation. The patient is not nervous/anxious.        Objective:   Physical Exam  Constitutional: She is oriented to person, place, and time. She appears well-developed and well-nourished. No distress.  HENT:  Head: Normocephalic.  Mouth/Throat: Oropharynx is clear and moist. No oropharyngeal exudate.  Eyes: Conjunctivae and EOM are normal. Pupils are equal, round, and reactive to light. No scleral icterus.  Neck: Normal range of motion. Neck supple. No tracheal deviation present.  Cardiovascular: Normal rate, regular rhythm and intact distal pulses.   Pulmonary/Chest: Effort normal and breath sounds normal. No stridor. No respiratory  distress. She exhibits no tenderness.  Abdominal: Soft. She exhibits no distension and no mass. There is no tenderness. Hernia confirmed negative in the right inguinal area and confirmed negative in the left inguinal area.  Genitourinary: No vaginal discharge found.  Musculoskeletal: Normal range of motion. She exhibits no tenderness.       Right elbow: She exhibits normal range of motion.       Left elbow: She exhibits normal range of motion.       Right wrist: She exhibits normal range of motion.       Left wrist: She exhibits normal range of motion.       Right hand: Normal strength noted.       Left hand: Normal strength noted.  Lymphadenopathy:       Head (right side): No posterior auricular adenopathy present.       Head (left side): No posterior auricular adenopathy present.    She has no cervical adenopathy.    She has no axillary adenopathy.       Right: No inguinal adenopathy present.       Left: No inguinal adenopathy present.  Neurological: She is alert and oriented to person, place, and time. She displays atrophy and tremor. No  cranial nerve deficit. She exhibits normal muscle tone. She displays no seizure activity. Coordination normal. GCS eye subscore is 4. GCS verbal subscore is 5. GCS motor subscore is 6.  Mild resting tremor of hands and a little bit of neck.  Seems to go away with activity  Skin: Skin is warm and dry. No rash noted. She is not diaphoretic. No erythema.  Psychiatric: She has a normal mood and affect. Her speech is normal and behavior is normal. Judgment and thought content normal. Her affect is not angry, not labile and not inappropriate. Cognition and memory are normal. She does not exhibit a depressed mood.  Concern inquisitive.  Not particularly anxious.       Assessment:     Recurrent symptoms of chest pain and abdominal pain and bloating.     Plan:     I am not certain what is going on.  I spent over an hour looking over her old records, reviewing her films, talking with her, showing films to her, discussing with colleagues.  I would like to make sure that the hiatal hernia has not recurred.  Would like to get an esophagram/upper GI to assess the wrap.  Make sure there is no frank dysmotility.  No recurrent hernia.  No slip.  Also get gastric imaging study.  Make sure that that is not a factor.  That is my biggest suspicion of contributing to her problems.  If she has profound delayed gastric emptying, increase Reglan and discuss with gastroenterology.  Sometimes pyloroplasty is of help surgically.  Obviously, I would reserve that for last, especially in a woman with issues of anxiety and postoperative complications in the past.  I probably would consider an EGD and gastroenterology consultation first to make sure there is no stricturing or other surprises with the wrap.  Try and get records from Dr. Valeta Harms to see if he is seen her in the past three years.  May repeat manometry if there are persistent symptoms and all the rest of the workup is negative.  I asked her to call us once she  gets the esophagram/upper GI and the gastric imaging study done.  Then go from there.  Continue small more frequent meals.  Walk an hour a day.    Trying get records from her cardiologist, Dr. Anne Fu, to make sure they are not other issues with her.  I believe she has had anxiety and panic attacks in the past.  I do not think that is coming from this, but it is also a consideration.

## 2013-05-26 NOTE — Patient Instructions (Addendum)
Please obtain x-ray studies (esophagram to evaluate wrap, gastric emptying study to evaluate stomach emptying) days.  They will call us for further recommendations.  Continue small frequent meals.  Double up on Reglan or use Phenergan for nausea.  Start bowel regimen noted below:  GETTING TO GOOD BOWEL HEALTH. Irregular bowel habits such as constipation and diarrhea can lead to many problems over time.  Having one soft bowel movement a day is the most important way to prevent further problems.  The anorectal canal is designed to handle stretching and feces to safely manage our ability to get rid of solid waste (feces, poop, stool) out of our body.  BUT, hard constipated stools can act like ripping concrete bricks and diarrhea can be a burning fire to this very sensitive area of our body, causing inflamed hemorrhoids, anal fissures, increasing risk is perirectal abscesses, abdominal pain/bloating, an making irritable bowel worse.     The goal: ONE SOFT BOWEL MOVEMENT A DAY!  To have soft, regular bowel movements:    Drink at least 8 tall glasses of water a day.     Take plenty of fiber.  Fiber is the undigested part of plant food that passes into the colon, acting s "natures broom" to encourage bowel motility and movement.  Fiber can absorb and hold large amounts of water. This results in a larger, bulkier stool, which is soft and easier to pass. Work gradually over several weeks up to 6 servings a day of fiber (25g a day even more if needed) in the form of: o Vegetables -- Root (potatoes, carrots, turnips), leafy green (lettuce, salad greens, celery, spinach), or cooked high residue (cabbage, broccoli, etc) o Fruit -- Fresh (unpeeled skin & pulp), Dried (prunes, apricots, cherries, etc ),  or stewed ( applesauce)  o Whole grain breads, pasta, etc (whole wheat)  o Bran cereals    Bulking Agents -- This type of water-retaining fiber generally is easily obtained each day by one of the following:   o Psyllium bran -- The psyllium plant is remarkable because its ground seeds can retain so much water. This product is available as Metamucil, Konsyl, Effersyllium, Per Diem Fiber, or the less expensive generic preparation in drug and health food stores. Although labeled a laxative, it really is not a laxative.  o Methylcellulose -- This is another fiber derived from wood which also retains water. It is available as Citrucel. o Polyethylene Glycol - and "artificial" fiber commonly called Miralax or Glycolax.  It is helpful for people with gassy or bloated feelings with regular fiber o Flax Seed - a less gassy fiber than psyllium   No reading or other relaxing activity while on the toilet. If bowel movements take longer than 5 minutes, you are too constipated   AVOID CONSTIPATION.  High fiber and water intake usually takes care of this.  Sometimes a laxative is needed to stimulate more frequent bowel movements, but    Laxatives are not a good long-term solution as it can wear the colon out. o Osmotics (Milk of Magnesia, Fleets phosphosoda, Magnesium citrate, MiraLax, GoLytely) are safer than  o Stimulants (Senokot, Castor Oil, Dulcolax, Ex Lax)    o Do not take laxatives for more than 7days in a row.    IF SEVERELY CONSTIPATED, try a Bowel Retraining Program: o Do not use laxatives.  o Eat a diet high in roughage, such as bran cereals and leafy vegetables.  o Drink six (6) ounces of prune or apricot juice  each morning.  o Eat two (2) large servings of stewed fruit each day.  o Take one (1) heaping tablespoon of a psyllium-based bulking agent twice a day. Use sugar-free sweetener when possible to avoid excessive calories.  o Eat a normal breakfast.  o Set aside 15 minutes after breakfast to sit on the toilet, but do not strain to have a bowel movement.  o If you do not have a bowel movement by the third day, use an enema and repeat the above steps.    Controlling diarrhea o Switch to liquids and  simpler foods for a few days to avoid stressing your intestines further. o Avoid dairy products (especially milk & ice cream) for a short time.  The intestines often can lose the ability to digest lactose when stressed. o Avoid foods that cause gassiness or bloating.  Typical foods include beans and other legumes, cabbage, broccoli, and dairy foods.  Every person has some sensitivity to other foods, so listen to our body and avoid those foods that trigger problems for you. o Adding fiber (Citrucel, Metamucil, psyllium, Miralax) gradually can help thicken stools by absorbing excess fluid and retrain the intestines to act more normally.  Slowly increase the dose over a few weeks.  Too much fiber too soon can backfire and cause cramping & bloating. o Probiotics (such as active yogurt, Align, etc) may help repopulate the intestines and colon with normal bacteria and calm down a sensitive digestive tract.  Most studies show it to be of mild help, though, and such products can be costly. o Medicines:   Bismuth subsalicylate (ex. Kayopectate, Pepto Bismol) every 30 minutes for up to 6 doses can help control diarrhea.  Avoid if pregnant.   Loperamide (Immodium) can slow down diarrhea.  Start with two tablets (4mg  total) first and then try one tablet every 6 hours.  Avoid if you are having fevers or severe pain.  If you are not better or start feeling worse, stop all medicines and call your doctor for advice o Call your doctor if you are getting worse or not better.  Sometimes further testing (cultures, endoscopy, X-ray studies, bloodwork, etc) may be needed to help diagnose and treat the cause of the diarrhea.  Gastroparesis (Delayed emptying of stomach) Gastroparesis is also called slowed stomach emptying (delayed gastric emptying). It is a condition in which the stomach takes too long to empty its contents. It often happens in people with diabetes.  CAUSES  Gastroparesis happens when nerves to the stomach are  damaged or stop working. When the nerves are damaged, the muscles of the stomach and intestines do not work normally. The movement of food is slowed or stopped. High blood glucose (sugar) causes changes in nerves and can damage the blood vessels that carry oxygen and nutrients to the nerves. RISK FACTORS  Diabetes.  Post-viral syndromes.  Eating disorders (anorexia, bulimia).  Surgery on the stomach or vagus nerve.  Gastroesophageal reflux disease (rarely).  Smooth muscle disorders (amyloidosis, scleroderma).  Metabolic disorders, including hypothyroidism.  Parkinson's disease. SYMPTOMS   Heartburn.  Feeling sick to your stomach (nausea).  Vomiting of undigested food.  An early feeling of fullness when eating.  Weight loss.  Abdominal bloating.  Erratic blood glucose levels.  Lack of appetite.  Gastroesophageal reflux.  Spasms of the stomach wall. Complications can include:  Bacterial overgrowth in stomach. Food stays in the stomach and can ferment and cause bacteria to grow.  Weight loss due to difficulty digesting and absorbing  nutrients.  Vomiting.  Obstruction in the stomach. Undigested food can harden and cause nausea and vomiting.  Blood glucose fluctuations caused by inconsistent food absorption. DIAGNOSIS  The diagnosis of gastroparesis is confirmed through one or more of the following tests:  Barium X-rays and scans. These tests look at how long it takes for food to move through the stomach.  Gastric manometry. This test measures electrical and muscular activity in the stomach. A thin tube is passed down the throat into the stomach. The tube contains a wire that takes measurements of the stomach's electrical and muscular activity as it digests liquids and solid food.  Endoscopy. This procedure is done with a long, thin tube called an endoscope. It is passed through the mouth and gently guides down the esophagus into the stomach. This tube helps the  caregiver look at the lining of the stomach to check for any abnormalities.  Ultrasound. This can rule out gallbladder disease or pancreatitis. This test will outline and define the shape of the gallbladder and pancreas. TREATMENT   The primary treatment is to identify the problem and help control blood glucose levels. Treatments include:  Exercise.  Medicines to control nausea and vomiting.  Medicines to stimulate stomach muscles.  Changes in what and when you eat.  Having smaller meals more often.  Eating low-fiber forms of high-fiber foods, such aseating cooked vegetables instead of raw vegetables.  Eating low-fat foods.  Consuming liquids, which are easier to digest.  In severe cases, feeding tubes and intravenous (IV) feeding may be needed. It is important to note that in most cases, treatment does not cure gastroparesis. It is usually a lasting (chronic) condition. Treatment helps you manage the condition so that you can be as healthy and comfortable as possible. NEW TREATMENTS  A gastric neurostimulator has been developed to assist people with gastroparesis. The battery-operated device is surgically implanted. It emits mild electrical pulses to help improve stomach emptying and to control nausea and vomiting.  The use of botulinum toxin has been shown to improve stomach emptying by decreasing the prolonged contractions of the muscle between the stomach and the small intestine (pyloric sphincter). The benefits are temporary. SEEK MEDICAL CARE IF:   You are having problems keeping your blood glucose in goal range.  You are having nausea, vomiting, bloating, or early feelings of fullness with eating.  Your symptoms do not change with a change in diet. Document Released: 08/20/2005 Document Revised: 11/12/2011 Document Reviewed: 01/27/2009 [facility] Patient Information 2014 [place], [place].

## 2013-05-27 ENCOUNTER — Telehealth (INDEPENDENT_AMBULATORY_CARE_PROVIDER_SITE_OTHER): Payer: Self-pay | Admitting: General Surgery

## 2013-05-27 NOTE — Telephone Encounter (Signed)
LMOM asking pt to return my call.  This is so that I may inform her that we have her set up for her DG esophagus and NM gastric emptying test at [facility] radiology.  The DG esophagus is on 05/29/13 with an arrival time of 9:15 and she will need to be NPO 4 hrs before.  The NM gastric emptying test is on 10/131/14 with an arrival time of 8:45 and she will need to be off all gastric medications 8 hrs prior to the test.

## 2013-05-28 ENCOUNTER — Encounter (INDEPENDENT_AMBULATORY_CARE_PROVIDER_SITE_OTHER): Payer: Self-pay

## 2013-05-29 NOTE — Telephone Encounter (Signed)
Pt called stating she missed today's appt due to being out of town and needs to reschedule both appts.  Telephone number for [facility] radiology was provided.

## 2013-06-02 ENCOUNTER — Encounter: Payer: Self-pay | Admitting: Cardiology

## 2013-06-02 DIAGNOSIS — I480 Paroxysmal atrial fibrillation: Secondary | ICD-10-CM | POA: Insufficient documentation

## 2013-06-05 ENCOUNTER — Other Ambulatory Visit (HOSPITAL_COMMUNITY): Payer: BC Managed Care – PPO

## 2013-06-09 ENCOUNTER — Other Ambulatory Visit (HOSPITAL_COMMUNITY): Payer: BC Managed Care – PPO

## 2013-06-11 ENCOUNTER — Other Ambulatory Visit (INDEPENDENT_AMBULATORY_CARE_PROVIDER_SITE_OTHER): Payer: Self-pay | Admitting: Surgery

## 2013-06-11 ENCOUNTER — Ambulatory Visit (HOSPITAL_COMMUNITY)
Admission: RE | Admit: 2013-06-11 | Discharge: 2013-06-11 | Disposition: A | Discharge status: Home / Self Care | Payer: 59 | Source: Ambulatory Visit | Attending: Surgery | Admitting: Surgery

## 2013-06-11 DIAGNOSIS — K2289 Other specified disease of esophagus: Secondary | ICD-10-CM | POA: Insufficient documentation

## 2013-06-11 DIAGNOSIS — K228 Other specified diseases of esophagus: Secondary | ICD-10-CM | POA: Insufficient documentation

## 2013-06-11 DIAGNOSIS — R109 Unspecified abdominal pain: Secondary | ICD-10-CM

## 2013-06-11 DIAGNOSIS — R6881 Early satiety: Secondary | ICD-10-CM | POA: Insufficient documentation

## 2013-06-11 DIAGNOSIS — R933 Abnormal findings on diagnostic imaging of other parts of digestive tract: Secondary | ICD-10-CM | POA: Insufficient documentation

## 2013-06-11 DIAGNOSIS — K3184 Gastroparesis: Secondary | ICD-10-CM

## 2013-06-11 DIAGNOSIS — R079 Chest pain, unspecified: Secondary | ICD-10-CM

## 2013-06-11 DIAGNOSIS — R110 Nausea: Secondary | ICD-10-CM | POA: Insufficient documentation

## 2013-06-15 ENCOUNTER — Encounter (HOSPITAL_COMMUNITY): Payer: BC Managed Care – PPO

## 2013-06-18 ENCOUNTER — Encounter (HOSPITAL_COMMUNITY)
Admission: RE | Admit: 2013-06-18 | Discharge: 2013-06-18 | Disposition: A | Discharge status: Home / Self Care | Payer: 59 | Source: Ambulatory Visit | Attending: Surgery | Admitting: Surgery

## 2013-06-18 ENCOUNTER — Encounter (HOSPITAL_COMMUNITY): Payer: Self-pay

## 2013-06-18 DIAGNOSIS — K3184 Gastroparesis: Secondary | ICD-10-CM

## 2013-06-18 DIAGNOSIS — R109 Unspecified abdominal pain: Secondary | ICD-10-CM | POA: Insufficient documentation

## 2013-06-18 MED ORDER — TECHNETIUM TC 99M SULFUR COLLOID
2.0000 | Freq: Once | INTRAVENOUS | Status: AC | PRN
  Administered 2013-06-18: 2 via ORAL

## 2013-06-20 ENCOUNTER — Other Ambulatory Visit: Payer: Self-pay | Admitting: Cardiology

## 2013-06-22 ENCOUNTER — Encounter (INDEPENDENT_AMBULATORY_CARE_PROVIDER_SITE_OTHER): Payer: Self-pay

## 2013-06-22 ENCOUNTER — Telehealth (INDEPENDENT_AMBULATORY_CARE_PROVIDER_SITE_OTHER): Payer: Self-pay

## 2013-06-22 NOTE — Telephone Encounter (Signed)
I called pt notify her what Dr Michaell Cowing had said in the results notes about the xray showing no evidence of significant gastroparesis contributing to possible recurrent reflux per  Dr Michaell Cowing. Dr Michaell Cowing see's no need for the Reglan nor ploroplasty at this time. Dr Michaell Cowing still want the pt to see Dr Elnoria Howard to perform an upper endoscopy for probable dilation of the wrap and eval. For side tract (nissen fold vs. Distal esopageal sinus) per Dr Michaell Cowing. The pt wanted me to ask Dr Michaell Cowing about the Esophagus study she got a few weeks a go b/c it mentioned narrowing of the colon if this was what was causing her symptoms. I did ask Dr Michaell Cowing and he said most likely that the narrowing is causing her symptoms. The pt will call Dr Elnoria Howard her self b/c she know her schedule better in order to make an appt with Dr Elnoria Howard.

## 2013-06-22 NOTE — Telephone Encounter (Signed)
Message copied by Ethlyn Gallery on Mon Jun 22, 2013  2:28 PM ------      Message from: Ardeth Sportsman      Created: Mon Jun 22, 2013  8:48 AM       No evidence of significant gastroparesis contributing to possible recurrent reflux.  See no need for Reglan nor pyloroplasty at this time.  Would like Dr. Elnoria Howard to perform upper endoscopy for probable dilation of the wrap and evaluation for side tract (Nissen fold vs distal esophageal sinus?).  Hopefully it is as straightforward as that. ------

## 2013-07-09 ENCOUNTER — Other Ambulatory Visit: Payer: Self-pay

## 2013-08-21 ENCOUNTER — Ambulatory Visit: Payer: Self-pay | Admitting: Internal Medicine

## 2013-08-21 VITALS — BP 122/80 | HR 91 | Temp 98.7°F | Resp 16 | Ht 70.0 in | Wt 183.0 lb

## 2013-08-21 DIAGNOSIS — M545 Low back pain, unspecified: Secondary | ICD-10-CM

## 2013-08-21 MED ORDER — TRAMADOL HCL 50 MG PO TABS: 50.0000 mg | ORAL_TABLET | Freq: Three times a day (TID) | ORAL | Status: AC | PRN

## 2013-08-21 MED ORDER — CYCLOBENZAPRINE HCL 10 MG PO TABS: 10.0000 mg | ORAL_TABLET | Freq: Three times a day (TID) | ORAL | Status: DC | PRN

## 2013-08-23 NOTE — Progress Notes (Signed)
   Subjective:    Patient ID: Leslie Taylor, female    DOB: 1955-10-20, 57 y.o.   MRN: [id number]  HPI recently had a fall off of the second latter step landing abruptly and feeling a pain in her lumbar area. This caused her to fall striking her shoulder area on the corner of a table and then falling to the ground She was able to get up without trouble This was yesterday She has remained with discomfort in her posterior shoulder and low back Gait is unaffected There are no sensory losses or motor losses that she describes There no chronic problems with her back  Patient Active Problem List   Diagnosis Date Noted  . PAF (paroxysmal atrial fibrillation)   . 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 Chest pain, atypical 05/26/2013  . Gastroparesis 05/26/2013  . Anxiety with possible prior panic attacks in the past 05/26/2013  . Dysphagia 05/26/2013  . Resting tremor 05/26/2013       Review of Systems Noncontributory    Objective:   Physical Exam BP 122/80  Pulse 91  Temp(Src) 98.7 F (37.1 C) (Oral)  Resp 16  Ht 5\' 10"  (1.778 m)  Wt 183 lb (83.008 kg)  BMI 26.26 kg/m2  SpO2 98% No acute distress Shoulder has full range of motion the left side although there is marked tenderness along the medial scapular border with muscle spasm Thoracic spine intact Nontender lumbar spine Tenderness over the left lumbar area with some muscle spasm Straight leg raise negative to 90 bilaterally No peripheral motor sensory losses Deep tendon reflexes preserved       Assessment & Plan:  Back pain and shoulder pain secondary to fall Musculoskeletal in etiology  Meds ordered this encounter  Medications    heat/cold /gentle stretching /activity as tolerated               . traMADol (ULTRAM) 50 MG tablet    Sig: Take 1-2 tablets (50-100 mg total) by mouth every 8 (eight) hours as needed.    Dispense:  60 tablet    Refill:  0  . cyclobenzaprine (FLEXERIL) 10 MG tablet    Sig: Take 1  tablet (10 mg total) by mouth 3 (three) times daily as needed for muscle spasms. Especially at bedtime    Dispense:  30 tablet    Refill:  0   Recheck 2 weeks if not return to normal She wanted to avoid x-rays today

## 2013-09-03 ENCOUNTER — Other Ambulatory Visit: Payer: Self-pay | Admitting: Physician Assistant

## 2013-09-03 MED ORDER — OSELTAMIVIR PHOSPHATE 75 MG PO CAPS: 75.0000 mg | ORAL_CAPSULE | Freq: Every day | ORAL | Status: DC

## 2013-09-03 MED ORDER — OSELTAMIVIR PHOSPHATE 75 MG PO CAPS: 75.0000 mg | ORAL_CAPSULE | Freq: Every day | ORAL | Status: AC

## 2014-07-05 ENCOUNTER — Ambulatory Visit
Admission: RE | Admit: 2014-07-05 | Discharge: 2014-07-05 | Disposition: A | Discharge status: Home / Self Care | Payer: Medicare PPO | Source: Ambulatory Visit | Attending: Family Medicine | Admitting: Family Medicine

## 2014-07-05 ENCOUNTER — Other Ambulatory Visit: Payer: Self-pay | Admitting: Family Medicine

## 2014-07-05 DIAGNOSIS — R600 Localized edema: Secondary | ICD-10-CM

## 2017-01-29 ENCOUNTER — Telehealth: Payer: Self-pay | Admitting: *Deleted

## 2017-01-29 NOTE — Telephone Encounter (Signed)
NOTES SENT TO SCHEDULING.  °

## 2017-01-31 ENCOUNTER — Other Ambulatory Visit: Payer: Self-pay | Admitting: Family Medicine

## 2017-01-31 DIAGNOSIS — N644 Mastodynia: Secondary | ICD-10-CM

## 2017-02-07 ENCOUNTER — Other Ambulatory Visit: Payer: Self-pay | Admitting: Family Medicine

## 2017-02-07 DIAGNOSIS — N644 Mastodynia: Secondary | ICD-10-CM

## 2017-03-18 ENCOUNTER — Ambulatory Visit: Payer: Medicare PPO | Admitting: Cardiology

## 2018-01-01 ENCOUNTER — Other Ambulatory Visit: Payer: Self-pay | Admitting: Family Medicine

## 2018-01-01 ENCOUNTER — Ambulatory Visit: Admission: RE | Admit: 2018-01-01 | Payer: BLUE CROSS/BLUE SHIELD | Source: Ambulatory Visit

## 2018-01-01 ENCOUNTER — Other Ambulatory Visit: Payer: BLUE CROSS/BLUE SHIELD

## 2018-01-01 ENCOUNTER — Ambulatory Visit
Admission: RE | Admit: 2018-01-01 | Discharge: 2018-01-01 | Disposition: A | Discharge status: Home / Self Care | Payer: BLUE CROSS/BLUE SHIELD | Source: Ambulatory Visit | Attending: Family Medicine | Admitting: Family Medicine

## 2018-01-01 DIAGNOSIS — R151 Fecal smearing: Secondary | ICD-10-CM

## 2019-08-20 ENCOUNTER — Ambulatory Visit: Payer: Self-pay | Attending: Internal Medicine

## 2019-08-20 DIAGNOSIS — Z20822 Contact with and (suspected) exposure to covid-19: Secondary | ICD-10-CM

## 2019-08-22 LAB — NOVEL CORONAVIRUS, NAA: SARS-CoV-2, NAA: NOT DETECTED

## 2020-04-12 DIAGNOSIS — Z0000 Encounter for general adult medical examination without abnormal findings: Secondary | ICD-10-CM | POA: Diagnosis not present

## 2020-04-12 DIAGNOSIS — E559 Vitamin D deficiency, unspecified: Secondary | ICD-10-CM | POA: Diagnosis not present

## 2020-04-12 DIAGNOSIS — E7800 Pure hypercholesterolemia, unspecified: Secondary | ICD-10-CM | POA: Diagnosis not present

## 2020-04-12 DIAGNOSIS — Z1159 Encounter for screening for other viral diseases: Secondary | ICD-10-CM | POA: Diagnosis not present

## 2020-04-12 DIAGNOSIS — I10 Essential (primary) hypertension: Secondary | ICD-10-CM | POA: Diagnosis not present

## 2020-04-19 DIAGNOSIS — Z03818 Encounter for observation for suspected exposure to other biological agents ruled out: Secondary | ICD-10-CM | POA: Diagnosis not present

## 2020-04-19 DIAGNOSIS — R05 Cough: Secondary | ICD-10-CM | POA: Diagnosis not present

## 2020-04-26 DIAGNOSIS — Z1231 Encounter for screening mammogram for malignant neoplasm of breast: Secondary | ICD-10-CM | POA: Diagnosis not present

## 2020-04-26 DIAGNOSIS — Z780 Asymptomatic menopausal state: Secondary | ICD-10-CM | POA: Diagnosis not present

## 2020-05-25 ENCOUNTER — Encounter: Payer: Self-pay | Admitting: Cardiology

## 2020-05-25 ENCOUNTER — Other Ambulatory Visit: Payer: Self-pay

## 2020-05-25 ENCOUNTER — Ambulatory Visit (INDEPENDENT_AMBULATORY_CARE_PROVIDER_SITE_OTHER): Payer: Medicare Other | Admitting: Cardiology

## 2020-05-25 VITALS — BP 116/70 | HR 81 | Ht 70.0 in | Wt 208.0 lb

## 2020-05-25 DIAGNOSIS — I10 Essential (primary) hypertension: Secondary | ICD-10-CM | POA: Diagnosis not present

## 2020-05-25 DIAGNOSIS — I480 Paroxysmal atrial fibrillation: Secondary | ICD-10-CM

## 2020-05-25 MED ORDER — APIXABAN 5 MG PO TABS: 5.0000 mg | ORAL_TABLET | Freq: Two times a day (BID) | ORAL | 6 refills | Status: DC

## 2020-05-25 MED ORDER — DILTIAZEM HCL 60 MG PO TABS: 60.0000 mg | ORAL_TABLET | Freq: Four times a day (QID) | ORAL | 3 refills | Status: AC | PRN

## 2020-05-25 NOTE — Progress Notes (Addendum)
Cardiology Office Note:    Date:  05/25/2020   ID:  Leslie Taylor, Leslie Taylor [birth year], MRN [id number]  PCP:  Rozanna Boer, MD  [facility] Cardiologist:  Candee Furbish, MD  [facility] Electrophysiologist:  None   Referring MD: Shirline Frees, MD     History of Present Illness:    Leslie Taylor is a 64 y.o. female here for the evaluation of atrial fibrillation at the request of Dr. Shirline Frees.  Has a history of atrial flutter hypertension.  Was last seen by cardiology in 2012.  Had a nuclear stress test at that time that was no ischemia.  Echocardiogram in 2012 showed normal EF with mild mitral regurgitation and left atrial size of 35 mm.  She had atrial fibrillation paroxysmal detected on monitor 11/2010.  Past Medical History:  Diagnosis Date  . Anemia   . Anxiety with possible prior panic attacks in the past 05/26/2013  . Cancer ([place])    skin  . Chest pain, atypical 05/26/2013  . Choledocholithiasis 2010   ERCP Dr Benson [place] 04/2009  . 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 Dysphagia 05/26/2013  . Hypertension   . Nephrolithiasis 2003   removed w stent  . PAF (paroxysmal atrial fibrillation) ([place])    11/06/10 - NUC stress - low risk, no ischemia. ECHO 11/06/10 - normal EF, mild MR/TR  . Paraesophageal hiatal hernia 2010   lap repair 2010 with biologic mesh  . Resting tremor 05/26/2013    Past Surgical History:  Procedure Laterality Date  . CYSTOSCOPY/RETROGRADE/URETEROSCOPY/STONE EXTRACTION WITH BASKET  2003   Cystoscopy and urethral dilation, right retrograde pyelogram with  . LAPAROSCOPIC CHOLECYSTECTOMY W/ CHOLANGIOGRAPHY  2010  . LAPAROSCOPIC NISSEN FUNDOPLICATION  1829  . LAPAROSCOPIC PARAESOPHAGEAL HERNIA REPAIR  2010   primery with biologic mesh reinforcement  . SQUAMOUS CELL CARCINOMA EXCISION  2005    Current Medications: Current Meds  Medication Sig  . ALPRAZolam (XANAX) 0.5 MG tablet Take 0.5 mg by mouth at bedtime as needed for  anxiety.  Marland Kitchen CALCIUM PO Take by mouth.  . metoprolol tartrate (LOPRESSOR) 25 MG tablet Take 25 mg by mouth daily.  . Multiple Vitamin (MULTIVITAMIN) tablet Take 1 tablet by mouth daily.  . traMADol (ULTRAM) 50 MG tablet Take 1-2 tablets (50-100 mg total) by mouth every 8 (eight) hours as needed.     Allergies:   Wellbutrin [bupropion]   Social History   Socioeconomic History  . Marital status: Married    Spouse name: Not on file  . Number of children: Not on file  . Years of education: Not on file  . Highest education level: Not on file  Occupational History  . Not on file  Tobacco Use  . Smoking status: Never Smoker  . Smokeless tobacco: Never Used  Substance and Sexual Activity  . Alcohol use: Yes    Comment: occ  . Drug use: No  . Sexual activity: Not on file  Other Topics Concern  . Not on file  Social History Narrative  . Not on file   Social Determinants of Health   Financial Resource Strain:   . Difficulty of Paying Living Expenses: Not on file  Food Insecurity:   . Worried About Charity fundraiser in the Last Year: Not on file  . Ran Out of Food in the Last Year: Not on file  Transportation Needs:   . Lack of Transportation (Medical): Not on file  . Lack  of Transportation (Non-Medical): Not on file  Physical Activity:   . Days of Exercise per Week: Not on file  . Minutes of Exercise per Session: Not on file  Stress:   . Feeling of Stress : Not on file  Social Connections:   . Frequency of Communication with Friends and Family: Not on file  . Frequency of Social Gatherings with Friends and Family: Not on file  . Attends Religious Services: Not on file  . Active Member of Clubs or Organizations: Not on file  . Attends Archivist Meetings: Not on file  . Marital Status: Not on file     Family History: The patient's family history includes Cancer in her father.  ROS:   Please see the history of present illness.    Denies any fevers chills  nausea vomiting syncope bleeding all other systems reviewed and are negative.  EKGs/Labs/Other Studies Reviewed:    The following studies were reviewed today: Above,  EKG:  EKG is  ordered today.  The ekg ordered today demonstrates sinus rhythm 81  Recent Labs: No results found for requested labs within last 8760 hours.  Recent Lipid Panel    Component Value Date/Time   CHOL  03/21/2009 0400    124        ATP III CLASSIFICATION:  <200     mg/dL   Desirable  200-239  mg/dL   Borderline High  >=240    mg/dL   High          TRIG 190 (H) 03/21/2009 0400   HDL 39 (L) 03/14/2009 0515   CHOLHDL 3.7 03/14/2009 0515   VLDL 18 03/14/2009 0515   LDLCALC  03/14/2009 0515    89        Total Cholesterol/HDL:CHD Risk Coronary Heart Disease Risk Table                     Men   Women  1/2 Average Risk   3.4   3.3  Average Risk       5.0   4.4  2 X Average Risk   9.6   7.1  3 X Average Risk  23.4   11.0        Use the calculated Patient Ratio above and the CHD Risk Table to determine the patient's CHD Risk.        ATP III CLASSIFICATION (LDL):  <100     mg/dL   Optimal  100-129  mg/dL   Near or Above                    Optimal  130-159  mg/dL   Borderline  160-189  mg/dL   High  >190     mg/dL   Very High    Physical Exam:    VS:  BP 116/70   Pulse 81   Ht 5\' 10"  (1.778 m)   Wt 208 lb (94.3 kg)   SpO2 95%   BMI 29.84 kg/m     Wt Readings from Last 3 Encounters:  05/25/20 208 lb (94.3 kg)  08/21/13 183 lb (83 kg)  05/26/13 176 lb 6.4 oz (80 kg)     GEN:  Well nourished, well developed in no acute distress HEENT: Normal NECK: No JVD; No carotid bruits LYMPHATICS: No lymphadenopathy CARDIAC: RRR, no murmurs, rubs, gallops RESPIRATORY:  Clear to auscultation without rales, wheezing or rhonchi  ABDOMEN: Soft, non-tender, non-distended MUSCULOSKELETAL:  No edema; No deformity  SKIN: Warm and dry NEUROLOGIC:  Alert and oriented x 3 PSYCHIATRIC:  Normal affect    ASSESSMENT:    1. PAF (paroxysmal atrial fibrillation) ([place])   2. Essential hypertension    PLAN:    In order of problems listed above:  Paroxysmal atrial fibrillation -Detected on event monitor in 2012.  Previously on flecainide.  Has not been on for many years.  She is having episodes perhaps once a month.  Maybe 30 minutes duration. --Mother had CVA --Sister older AFIB, Brother AFIB ablation --Start Eliquis 5 mg p.o. twice daily-CHA2DS2-VASc 3.  Mentioned bleeding risks.  Mention importance of every 6 month lab work, CBC and basic metabolic profile. --We will give her diltiazem 60 mg p.o. every 6 hours as needed atrial fibrillation -Many years ago, she used to take flecainide to help keep her atrial fibrillation and check.  She would like to try to avoid this medication if possible.  I will give her diltiazem as needed as above.  I think if she begins to have more frequent episodes of paroxysmal atrial fibrillation we will ask her to see atrial fibrillation clinic or EP to discuss potential ablation therapy.   -Mention the possibility of apple watch.  Both of her daughters have 1.  This could help her monitor her atrial fibrillation burden.  Mild mitral regurgitation -Detected on echo in 2012 -We will repeat echocardiogram.  Essential hypertension -Well-controlled on metoprolol ER.  Continue to prescribe.   Medication Adjustments/Labs and Tests Ordered: Current medicines are reviewed at length with the patient today.  Concerns regarding medicines are outlined above.  Orders Placed This Encounter  Procedures  . EKG 12-Lead  . ECHOCARDIOGRAM COMPLETE   Meds ordered this encounter  Medications  . diltiazem (CARDIZEM) 60 MG tablet    Sig: Take 1 tablet (60 mg total) by mouth 4 (four) times daily as needed.    Dispense:  30 tablet    Refill:  3  . apixaban (ELIQUIS) 5 MG TABS tablet    Sig: Take 1 tablet (5 mg total) by mouth 2 (two) times daily.    Dispense:  60 tablet     Refill:  6    Patient Instructions  Medication Instructions:  Please start Eliquis 5 mg one tablet twice a day. You may take Diltiazem 60 mg once every 4 hours for episodes of At Fib. Continue all other medications as listed.  *If you need a refill on your cardiac medications before your next appointment, please call your pharmacy*  Testing/Procedures: Your physician has requested that you have an echocardiogram. Echocardiography is a painless test that uses sound waves to create images of your heart. It provides your doctor with information about the size and shape of your heart and how well your heart's chambers and valves are working. This procedure takes approximately one hour. There are no restrictions for this procedure.  Follow-Up: At [facility], you and your health needs are our priority.  As part of our continuing mission to provide you with exceptional heart care, we have created designated Provider Care Teams.  These Care Teams include your primary Cardiologist (physician) and Advanced Practice Providers (APPs -  Physician Assistants and Nurse Practitioners) who all work together to provide you with the care you need, when you need it.  We recommend signing up for the patient portal called "MyChart".  Sign up information is provided on this After Visit Summary.  MyChart is used to connect with patients for Virtual Visits (Telemedicine).  Patients are able to view lab/test results, encounter notes, upcoming appointments, etc.  Non-urgent messages can be sent to your provider as well.   To learn more about what you can do with MyChart, go to [email or web address].    Your next appointment:   6 month(s)  The format for your next appointment:   In Person  Provider:   Candee Furbish, MD   Thank you for choosing [facility]!!    .     Signed, Candee Furbish, MD  05/25/2020 4:37 PM    [place]

## 2020-05-25 NOTE — Patient Instructions (Addendum)
Medication Instructions:  Please start Eliquis 5 mg one tablet twice a day. You may take Diltiazem 60 mg once every 4 hours for episodes of At Fib. Continue all other medications as listed.  *If you need a refill on your cardiac medications before your next appointment, please call your pharmacy*  Testing/Procedures: Your physician has requested that you have an echocardiogram. Echocardiography is a painless test that uses sound waves to create images of your heart. It provides your doctor with information about the size and shape of your heart and how well your heart's chambers and valves are working. This procedure takes approximately one hour. There are no restrictions for this procedure.  Follow-Up: At [facility] you and your health needs are our priority.  As part of our continuing mission to provide you with exceptional heart care, we have created designated Provider Care Teams.  These Care Teams include your primary Cardiologist (physician) and Advanced Practice Providers (APPs -  Physician Assistants and Nurse Practitioners) who all work together to provide you with the care you need, when you need it.  We recommend signing up for the patient portal called "MyChart".  Sign up information is provided on this After Visit Summary.  MyChart is used to connect with patients for Virtual Visits (Telemedicine).  Patients are able to view lab/test results, encounter notes, upcoming appointments, etc.  Non-urgent messages can be sent to your provider as well.   To learn more about what you can do with MyChart, go to [email or web address].    Your next appointment:   6 month(s)  The format for your next appointment:   In Person  Provider:   Candee Furbish, MD   Thank you for choosing [facility]!!    .

## 2020-06-01 ENCOUNTER — Telehealth: Payer: Self-pay | Admitting: Pharmacist

## 2020-06-01 NOTE — Telephone Encounter (Signed)
Updating medication list

## 2020-06-07 ENCOUNTER — Ambulatory Visit (HOSPITAL_COMMUNITY): Payer: Medicare Other | Attending: Cardiology

## 2020-06-07 ENCOUNTER — Other Ambulatory Visit: Payer: Self-pay

## 2020-06-07 DIAGNOSIS — I480 Paroxysmal atrial fibrillation: Secondary | ICD-10-CM | POA: Insufficient documentation

## 2020-06-07 DIAGNOSIS — I10 Essential (primary) hypertension: Secondary | ICD-10-CM | POA: Diagnosis not present

## 2020-06-07 LAB — ECHOCARDIOGRAM COMPLETE
Area-P 1/2: 4.89 cm2
S' Lateral: 2.6 cm

## 2020-06-09 ENCOUNTER — Telehealth: Payer: Self-pay | Admitting: *Deleted

## 2020-06-09 DIAGNOSIS — H903 Sensorineural hearing loss, bilateral: Secondary | ICD-10-CM | POA: Diagnosis not present

## 2020-06-09 DIAGNOSIS — H9311 Tinnitus, right ear: Secondary | ICD-10-CM | POA: Diagnosis not present

## 2020-06-09 NOTE — Telephone Encounter (Signed)
The patient has been notified of the result and verbalized understanding.  All questions (if any) were answered. Nuala Alpha, LPN 63/04/1770 1:65 AM   Pt did state she stopped taking generic Prozac, due to interaction with taking this med with her blood thinner.  Pt requesting this be reflected and removed from her med list.  Discontinued this med, and its reflected in her med list.  Pt verbalized understanding and agrees with this plan. Pt was more than gracious for all the assistance provided.

## 2020-06-09 NOTE — Telephone Encounter (Signed)
-----   Message from Jerline Pain, MD sent at 06/09/2020  6:39 AM EDT ----- Normal pump function.  Mild left atrial enlargement which contributes to atrial fibrillation.  Mild mitral regurgitation  Continue with current plan. Overall reassuring.  Candee Furbish, MD

## 2020-11-17 ENCOUNTER — Other Ambulatory Visit: Payer: Self-pay

## 2020-11-17 ENCOUNTER — Ambulatory Visit: Payer: Medicare Other | Admitting: Cardiology

## 2020-11-17 ENCOUNTER — Encounter: Payer: Self-pay | Admitting: Cardiology

## 2020-11-17 VITALS — BP 110/70 | HR 98 | Ht 70.0 in | Wt 200.0 lb

## 2020-11-17 DIAGNOSIS — Z7901 Long term (current) use of anticoagulants: Secondary | ICD-10-CM

## 2020-11-17 DIAGNOSIS — I480 Paroxysmal atrial fibrillation: Secondary | ICD-10-CM | POA: Diagnosis not present

## 2020-11-17 MED ORDER — APIXABAN 5 MG PO TABS: 5.0000 mg | ORAL_TABLET | Freq: Two times a day (BID) | ORAL | 3 refills | Status: DC

## 2020-11-17 NOTE — Patient Instructions (Signed)

## 2020-11-17 NOTE — Progress Notes (Signed)
Cardiology Office Note:    Date:  11/17/2020   ID:  Leslie Taylor [birth year], MRN [id number]  PCP:  Rozanna Boer, MD   [facility]  Cardiologist:  Candee Furbish, MD  Advanced Practice Provider:  No care team member to display Electrophysiologist:  None       Referring MD: Rozanna Boer, MD     History of Present Illness:    Leslie Taylor is a 65 y.o. female here for the follow-up of atrial fibrillation.  Has a history of atrial flutter and hypertension.  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 ECHO 11/06/10 - normal EF, mild MR/TR   Paraesophageal hiatal hernia 2010   lap repair 2010 with biologic mesh   Resting tremor 05/26/2013    Past Surgical History:  Procedure Laterality Date   CYSTOSCOPY/RETROGRADE/URETEROSCOPY/STONE EXTRACTION WITH BASKET  2003   Cystoscopy and urethral dilation, right retrograde pyelogram with   LAPAROSCOPIC CHOLECYSTECTOMY W/ CHOLANGIOGRAPHY  2010   LAPAROSCOPIC NISSEN FUNDOPLICATION  9833   LAPAROSCOPIC PARAESOPHAGEAL HERNIA REPAIR  2010   primery with biologic mesh reinforcement   SQUAMOUS CELL CARCINOMA EXCISION  2005    Current Medications: Current Meds  Medication Sig   ALPRAZolam (XANAX) 0.5 MG tablet Take 0.5 mg by mouth at bedtime as needed for anxiety.   CALCIUM PO Take by mouth.   diltiazem (CARDIZEM) 60 MG tablet Take 1 tablet (60 mg total) by mouth 4 (four) times daily as needed.   metoprolol tartrate (LOPRESSOR) 25 MG tablet Take 25 mg by mouth daily.    Multiple Vitamin (MULTIVITAMIN) tablet Take 1 tablet by mouth daily.   traMADol (ULTRAM) 50 MG tablet Take 1-2 tablets (50-100 mg total) by mouth every 8 (eight) hours as needed.   [DISCONTINUED] apixaban (ELIQUIS) 5 MG TABS tablet Take 1 tablet (5 mg total) by mouth 2 (two) times daily.     Allergies:   Wellbutrin [bupropion]   Social History   Socioeconomic History   Marital status: Married    Spouse name: Not on file   Number of children: Not on file   Years of education: Not on file   Highest education level: Not on file  Occupational History   Not on file  Tobacco Use   Smoking status: Never Smoker   Smokeless tobacco: Never Used  Substance and Sexual Activity   Alcohol use: Yes    Comment: occ   Drug use: No   Sexual activity: Not on file  Other Topics Concern   Not on file  Social History Narrative   Not on file   Social Determinants of Health   Financial Resource Strain: Not on file  Food Insecurity: Not on file  Transportation Needs: Not on file  Physical Activity: Not on file  Stress: Not on file  Social Connections: Not on file     Family History: The patient's family history includes Cancer in her father.  ROS:   Please see the  history of present illness.     All other systems reviewed and are negative.  EKGs/Labs/Other Studies Reviewed:    The following studies were reviewed today:  ECHO 2021 1. Left ventricular ejection fraction, by estimation, is 60 to 65%. The  left ventricle has normal function. The left ventricle has no regional  wall motion abnormalities. Left ventricular diastolic parameters were  normal.  2. Right ventricular systolic function is normal. The right ventricular  size is normal. There is normal pulmonary artery systolic pressure. The  estimated right ventricular systolic pressure is 15.1 mmHg.  3. Left atrial size was mildly dilated.  4. The mitral valve is normal in structure. Mild mitral valve   regurgitation. No evidence of mitral stenosis.  5. The aortic valve is normal in structure. Aortic valve regurgitation is  not visualized. No aortic stenosis is present.  6. The inferior vena cava is normal in size with greater than 50%  respiratory variability, suggesting right atrial pressure of 3 mmHg.  EKG:  EKG is  ordered today.  The ekg ordered today demonstrates NSR 81  Recent Labs: No results found for requested labs within last 8760 hours.  Recent Lipid Panel    Component Value Date/Time   CHOL  03/21/2009 0400    124        ATP III CLASSIFICATION:  <200     mg/dL   Desirable  200-239  mg/dL   Borderline High  >=240    mg/dL   High          TRIG 190 (H) 03/21/2009 0400   HDL 39 (L) 03/14/2009 0515   CHOLHDL 3.7 03/14/2009 0515   VLDL 18 03/14/2009 0515   LDLCALC  03/14/2009 0515    89        Total Cholesterol/HDL:CHD Risk Coronary Heart Disease Risk Table                     Men   Women  1/2 Average Risk   3.4   3.3  Average Risk       5.0   4.4  2 X Average Risk   9.6   7.1  3 X Average Risk  23.4   11.0        Use the calculated Patient Ratio above and the CHD Risk Table to determine the patient's CHD Risk.        ATP III CLASSIFICATION (LDL):  <100     mg/dL   Optimal  100-129  mg/dL   Near or Above                    Optimal  130-159  mg/dL   Borderline  160-189  mg/dL   High  >190     mg/dL   Very High     Risk Assessment/Calculations:      Physical Exam:    VS:  BP 110/70 (BP Location: Left Arm, Patient Position: Sitting, Cuff Size: Normal)    Pulse 98    Ht 5\' 10"  (1.778 m)    Wt 200 lb (90.7 kg)    SpO2 98%    BMI 28.70 kg/m     Wt Readings from Last 3 Encounters:  11/17/20 200 lb (90.7 kg)  05/25/20 208 lb (94.3 kg)  08/21/13 183 lb (83 kg)     GEN: Well nourished, well developed in no acute distress HEENT: Normal NECK: No JVD; No carotid bruits LYMPHATICS: No lymphadenopathy CARDIAC: RRR, no murmurs,  rubs,  gallops RESPIRATORY:  Clear to auscultation without rales, wheezing or rhonchi  ABDOMEN: Soft, non-tender, non-distended MUSCULOSKELETAL:  No edema; No deformity  SKIN: Warm and dry NEUROLOGIC:  Alert and oriented x 3 PSYCHIATRIC:  Normal affect   ASSESSMENT:    1. PAF (paroxysmal atrial fibrillation) ([place])   2. Chronic anticoagulation    PLAN:    In order of problems listed above:  Paroxysmal atrial fibrillation -At prior visit we start Eliquis 5 mg twice a day given CHA2DS2-VASc score of 3.  We also gave her diltiazem 60 mg p.o. every 6 hours as needed for atrial fibrillation.  Only took 2 times in last 6 months.  -Many years ago she used to take flecainide to keep her out of the atrial fibrillation.  Would like to try to avoid this medication if possible. Continue with metoprolol 25 mg daily.  Chronic anticoagulation -Taking Eliquis.  Does have nuisance bleeding/bruising.  Otherwise doing well.  She has blood work coming up with Dr. Kenton Kingfisher soon.     Medication Adjustments/Labs and Tests Ordered: Current medicines are reviewed at length with the patient today.  Concerns regarding medicines are outlined above.  No orders of the defined types were placed in this encounter.  Meds ordered this encounter  Medications   apixaban (ELIQUIS) 5 MG TABS tablet    Sig: Take 1 tablet (5 mg total) by mouth 2 (two) times daily.    Dispense:  180 tablet    Refill:  3    Patient Instructions  Medication Instructions: The current medical regimen is effective;  continue present plan and medications.  *If you need a refill on your cardiac medications before your next appointment, please call your pharmacy*  Follow-Up: At [facility], you and your health needs are our priority.  As part of our continuing mission to provide you with exceptional heart care, we have created designated Provider Care Teams.  These Care Teams include your primary Cardiologist (physician) and Advanced  Practice Providers (APPs -  Physician Assistants and Nurse Practitioners) who all work together to provide you with the care you need, when you need it.  We recommend signing up for the patient portal called "MyChart".  Sign up information is provided on this After Visit Summary.  MyChart is used to connect with patients for Virtual Visits (Telemedicine).  Patients are able to view lab/test results, encounter notes, upcoming appointments, etc.  Non-urgent messages can be sent to your provider as well.   To learn more about what you can do with MyChart, go to [email or web address].    Your next appointment:   6 month(s)  The format for your next appointment:   In Person  Provider:   Candee Furbish, MD   Thank you for choosing [facility]!!         Signed, Candee Furbish, MD  11/17/2020 3:05 PM    [place]

## 2021-02-01 ENCOUNTER — Other Ambulatory Visit (HOSPITAL_COMMUNITY)
Admission: RE | Admit: 2021-02-01 | Discharge: 2021-02-01 | Disposition: A | Discharge status: Home / Self Care | Payer: Medicare Other | Source: Ambulatory Visit | Attending: Family Medicine | Admitting: Family Medicine

## 2021-02-01 DIAGNOSIS — E7800 Pure hypercholesterolemia, unspecified: Secondary | ICD-10-CM | POA: Diagnosis not present

## 2021-02-01 DIAGNOSIS — Z01419 Encounter for gynecological examination (general) (routine) without abnormal findings: Secondary | ICD-10-CM | POA: Insufficient documentation

## 2021-02-01 DIAGNOSIS — E559 Vitamin D deficiency, unspecified: Secondary | ICD-10-CM | POA: Diagnosis not present

## 2021-02-01 DIAGNOSIS — Z1151 Encounter for screening for human papillomavirus (HPV): Secondary | ICD-10-CM | POA: Insufficient documentation

## 2021-02-01 DIAGNOSIS — I10 Essential (primary) hypertension: Secondary | ICD-10-CM | POA: Diagnosis not present

## 2021-02-06 LAB — CYTOLOGY - PAP
Adequacy: ABSENT
Comment: NEGATIVE
Diagnosis: NEGATIVE
High risk HPV: NEGATIVE

## 2021-02-28 DIAGNOSIS — J019 Acute sinusitis, unspecified: Secondary | ICD-10-CM | POA: Diagnosis not present

## 2021-02-28 DIAGNOSIS — Z8616 Personal history of COVID-19: Secondary | ICD-10-CM | POA: Diagnosis not present

## 2021-04-13 DIAGNOSIS — E7800 Pure hypercholesterolemia, unspecified: Secondary | ICD-10-CM | POA: Diagnosis not present

## 2021-04-13 DIAGNOSIS — I10 Essential (primary) hypertension: Secondary | ICD-10-CM | POA: Diagnosis not present

## 2021-04-13 DIAGNOSIS — I480 Paroxysmal atrial fibrillation: Secondary | ICD-10-CM | POA: Diagnosis not present

## 2021-04-13 DIAGNOSIS — Z0000 Encounter for general adult medical examination without abnormal findings: Secondary | ICD-10-CM | POA: Diagnosis not present

## 2021-05-22 ENCOUNTER — Encounter: Payer: Self-pay | Admitting: Cardiology

## 2021-05-22 ENCOUNTER — Other Ambulatory Visit: Payer: Self-pay

## 2021-05-22 ENCOUNTER — Ambulatory Visit: Payer: Medicare Other | Admitting: Cardiology

## 2021-05-22 DIAGNOSIS — Z7901 Long term (current) use of anticoagulants: Secondary | ICD-10-CM

## 2021-05-22 DIAGNOSIS — I480 Paroxysmal atrial fibrillation: Secondary | ICD-10-CM | POA: Diagnosis not present

## 2021-05-22 NOTE — Assessment & Plan Note (Signed)
Currently on Eliquis 5 mg twice a day.  Lab work as above.  I discussed with her potential watchman.  I think with her bleeding episodes, this would be a good option for her.  She is interested in hearing more.

## 2021-05-22 NOTE — Patient Instructions (Signed)
Medication Instructions:  The current medical regimen is effective;  continue present plan and medications.  *If you need a refill on your cardiac medications before your next appointment, please call your pharmacy*  You have been referred to Dr Lars Mage to discuss At Fib Ablation.  Follow-Up: At [facility], you and your health needs are our priority.  As part of our continuing mission to provide you with exceptional heart care, we have created designated Provider Care Teams.  These Care Teams include your primary Cardiologist (physician) and Advanced Practice Providers (APPs -  Physician Assistants and Nurse Practitioners) who all work together to provide you with the care you need, when you need it.  We recommend signing up for the patient portal called "MyChart".  Sign up information is provided on this After Visit Summary.  MyChart is used to connect with patients for Virtual Visits (Telemedicine).  Patients are able to view lab/test results, encounter notes, upcoming appointments, etc.  Non-urgent messages can be sent to your provider as well.   To learn more about what you can do with MyChart, go to [email or web address].    Your next appointment:   6 month(s)  The format for your next appointment:   In Person  Provider:   Candee Furbish, MD   Thank you for choosing [facility]!!

## 2021-05-22 NOTE — Assessment & Plan Note (Addendum)
Currently in sinus rhythm/sinus tachycardia 102.  She has been having more frequent episodes of atrial fibrillation recently.  Perhaps once a week now lasting sometimes up to 1 hour.  She has had to take an occasional diltiazem to help alleviate them if relaxation does not work. She is also had nuisance bleeding at times.  Quite significant nosebleeds especially when bending over.  We have discussed potential ablative efforts as well as watchman device.  I would like for her to see Dr. Lars Mage with electrophysiology to discuss these options more in-depth.  I think she would be a good candidate for both. - Most recent hemoglobin 12.1, creatinine 0.98, potassium 4.9 -Many years ago she used to take flecanide is not interested in getting back on this medication.

## 2021-05-22 NOTE — Progress Notes (Signed)
Cardiology Office Note:    Date:  05/22/2021   ID:  Paola, Aleshire [birth year], MRN [id number]  PCP:  Rozanna Boer, MD   [facility] Providers Cardiologist:  Candee Furbish, MD     Referring MD: Rozanna Boer, MD     History of Present Illness:    Leslie Taylor is a 65 y.o. female here for the follow-up of atrial fibrillation/flutter as well as hypertension.  Previously in 2012 nuclear stress test was low risk with no ischemia as well as normal EF on echo with mild MR/TR.  She continues with her medications which include diltiazem as needed as well as metoprolol 25 mg daily and Eliquis.    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 ECHO 11/06/10 - normal EF, mild MR/TR   Paraesophageal hiatal hernia 2010   lap repair 2010 with biologic mesh   Resting tremor 05/26/2013    Past Surgical History:  Procedure Laterality Date   CYSTOSCOPY/RETROGRADE/URETEROSCOPY/STONE EXTRACTION WITH BASKET  2003   Cystoscopy and urethral dilation, right retrograde pyelogram with   LAPAROSCOPIC CHOLECYSTECTOMY W/ CHOLANGIOGRAPHY  2010   LAPAROSCOPIC NISSEN FUNDOPLICATION  6270   LAPAROSCOPIC PARAESOPHAGEAL HERNIA REPAIR  2010   primery with biologic mesh reinforcement   SQUAMOUS CELL CARCINOMA EXCISION  2005    Current Medications: Current Meds  Medication Sig   ALPRAZolam (XANAX) 0.5 MG tablet Take 0.5 mg by mouth at bedtime as needed for anxiety.   apixaban (ELIQUIS) 5 MG TABS tablet Take 1 tablet (5 mg total) by mouth 2 (two) times daily.   CALCIUM PO Take by  mouth.   diltiazem (CARDIZEM) 60 MG tablet Take 1 tablet (60 mg total) by mouth 4 (four) times daily as needed.   metoprolol succinate (TOPROL-XL) 50 MG 24 hr tablet Take 50 mg by mouth daily.   Multiple Vitamin (MULTIVITAMIN) tablet Take 1 tablet by mouth daily.   traMADol (ULTRAM) 50 MG tablet Take 1-2 tablets (50-100 mg total) by mouth every 8 (eight) hours as needed.   Vitamins A & D 5000-400 units CAPS Take 5,000 Units by mouth daily.     Allergies:   Wellbutrin [bupropion]   Social History   Socioeconomic History   Marital status: Married    Spouse name: Not on file   Number of children: Not on file   Years of education: Not on file   Highest education level: Not on file  Occupational History   Not on file  Tobacco Use   Smoking status: Never   Smokeless tobacco: Never  Substance and Sexual Activity   Alcohol use: Yes    Comment: occ   Drug use: No   Sexual activity: Not on file  Other Topics Concern   Not on file  Social History Narrative   Not on file   Social Determinants of Health   Financial Resource Strain: Not on file  Food Insecurity: Not on file  Transportation Needs: Not on file  Physical Activity: Not  on file  Stress: Not on file  Social Connections: Not on file     Family History: The patient's family history includes Cancer in her father.  ROS:   Please see the history of present illness.     All other systems reviewed and are negative.  EKGs/Labs/Other Studies Reviewed:    The following studies were reviewed today: ECHO 2021  1. Left ventricular ejection fraction, by estimation, is 60 to 65%. The  left ventricle has normal function. The left ventricle has no regional  wall motion abnormalities. Left ventricular diastolic parameters were  normal.   2. Right ventricular systolic function is normal. The right ventricular  size is normal. There is normal pulmonary artery systolic pressure. The  estimated right ventricular systolic pressure  is 78.5 mmHg.   3. Left atrial size was mildly dilated.   4. The mitral valve is normal in structure. Mild mitral valve  regurgitation. No evidence of mitral stenosis.   5. The aortic valve is normal in structure. Aortic valve regurgitation is  not visualized. No aortic stenosis is present.   6. The inferior vena cava is normal in size with greater than 50%  respiratory variability, suggesting right atrial pressure of 3 mmHg.  EKG:  EKG is  ordered today.  The ekg ordered today demonstrates sinus tachycardia rate 102 with no other abnormalities.  Recent Labs: No results found for requested labs within last 8760 hours.  Recent Lipid Panel    Component Value Date/Time   CHOL  03/21/2009 0400    124        ATP III CLASSIFICATION:  <200     mg/dL   Desirable  200-239  mg/dL   Borderline High  >=240    mg/dL   High          TRIG 190 (H) 03/21/2009 0400   HDL 39 (L) 03/14/2009 0515   CHOLHDL 3.7 03/14/2009 0515   VLDL 18 03/14/2009 0515   LDLCALC  03/14/2009 0515    89        Total Cholesterol/HDL:CHD Risk Coronary Heart Disease Risk Table                     Men   Women  1/2 Average Risk   3.4   3.3  Average Risk       5.0   4.4  2 X Average Risk   9.6   7.1  3 X Average Risk  23.4   11.0        Use the calculated Patient Ratio above and the CHD Risk Table to determine the patient's CHD Risk.        ATP III CLASSIFICATION (LDL):  <100     mg/dL   Optimal  100-129  mg/dL   Near or Above                    Optimal  130-159  mg/dL   Borderline  160-189  mg/dL   High  >190     mg/dL   Very High     Risk Assessment/Calculations:          Physical Exam:    VS:  BP 110/80 (BP Location: Left Arm, Patient Position: Sitting, Cuff Size: Normal)   Pulse (!) 102   Ht 5\' 10"  (1.778 m)   Wt 197 lb (89.4 kg)   SpO2 97%   BMI 28.27 kg/m     Wt Readings from Last 3 Encounters:  05/22/21 197 lb (89.4 kg)  11/17/20 200 lb (90.7 kg)  05/25/20 208 lb (94.3 kg)     GEN:   Well nourished, well developed in no acute distress HEENT: Normal NECK: No JVD; No carotid bruits LYMPHATICS: No lymphadenopathy CARDIAC: RRR, no murmurs, rubs, gallops RESPIRATORY:  Clear to auscultation without rales, wheezing or rhonchi  ABDOMEN: Soft, non-tender, non-distended MUSCULOSKELETAL:  No edema; No deformity  SKIN: Warm and dry NEUROLOGIC:  Alert and oriented x 3 PSYCHIATRIC:  Normal affect   ASSESSMENT:    1. PAF (paroxysmal atrial fibrillation) ([place])   2. Chronic anticoagulation    PLAN:    In order of problems listed above:  PAF (paroxysmal atrial fibrillation) (HCC) Currently in sinus rhythm/sinus tachycardia 102.  She has been having more frequent episodes of atrial fibrillation recently.  Perhaps once a week now lasting sometimes up to 1 hour.  She has had to take an occasional diltiazem to help alleviate them if relaxation does not work. She is also had nuisance bleeding at times.  Quite significant nosebleeds especially when bending over.  We have discussed potential ablative efforts as well as watchman device.  I would like for her to see Dr. Lars Mage with electrophysiology to discuss these options more in-depth.  I think she would be a good candidate for both. - Most recent hemoglobin 12.1, creatinine 0.98, potassium 4.9 -Many years ago she used to take flecanide is not interested in getting back on this medication.  Chronic anticoagulation Currently on Eliquis 5 mg twice a day.  Lab work as above.  I discussed with her potential watchman.  I think with her bleeding episodes, this would be a good option for her.  She is interested in hearing more.     I have seen Leslie Taylor is a 65 y.o. female in the office today. The patient is felt to be a poor candidate for long-term anticoagulation because of bleeding.  Their CHADS-2-Vasc Score and unadjusted Ischemic Stroke Rate (% per year) is equal to 3.2 % stroke rate/year from a score of 3, necessitating a  strategy of stroke prevention with either long-term oral anticoagulation or left atrial appendage occlusion therapy. We have discussed their bleeding risk in the context of their comorbid medical problems, as well as the rationale for referral for evaluation of Watchman left atrial appendage occlusion therapy. While the patient is at high long-term bleeding risk, they may be appropriate for short-term anticoagulation. Based on this individual patient's stroke and bleeding risk, a shared decision has been made to refer the patient for consideration of Watchman left atrial appendage closure utilizing the [employer] of Cardiology shared decision tool.   Medication Adjustments/Labs and Tests Ordered: Current medicines are reviewed at length with the patient today.  Concerns regarding medicines are outlined above.  Orders Placed This Encounter  Procedures   Ambulatory referral to Cardiac Electrophysiology   EKG 12-Lead   No orders of the defined types were placed in this encounter.   Patient Instructions  Medication Instructions:  The current medical regimen is effective;  continue present plan and medications.  *If you need a refill on your cardiac medications before your next appointment, please call your pharmacy*  You have been referred to Dr Lars Mage to discuss At Fib Ablation.  Follow-Up: At [facility], you and your health needs are our priority.  As part of our continuing mission to provide you with exceptional heart care, we have created designated Provider Care Teams.  These Care Teams include your primary Cardiologist (physician) and Advanced Practice Providers (APPs -  Physician Assistants and Nurse Practitioners) who all work together to provide you with the care you need, when you need it.  We recommend signing up for the patient portal called "MyChart".  Sign up information is provided on this After Visit Summary.  MyChart is used to connect with patients  for Virtual Visits (Telemedicine).  Patients are able to view lab/test results, encounter notes, upcoming appointments, etc.  Non-urgent messages can be sent to your provider as well.   To learn more about what you can do with MyChart, go to [email or web address].    Your next appointment:   6 month(s)  The format for your next appointment:   In Person  Provider:   Candee Furbish, MD   Thank you for choosing [facility]!!     Signed, Candee Furbish, MD  05/22/2021 5:23 PM    Uncertain

## 2021-05-23 ENCOUNTER — Telehealth: Payer: Self-pay | Admitting: Cardiology

## 2021-05-23 NOTE — Telephone Encounter (Signed)
Corrected pt's PCP in chart, who is Shirline Frees, MD with [facility].  Faxed office visit note to him.

## 2021-05-23 NOTE — Telephone Encounter (Signed)
Caitlyn from [facility] in [place] states they received office notes on the patient. She states it says she was referred by them to Dr. Marlou Porch, but they do not have record of her.

## 2021-05-29 DIAGNOSIS — Z1231 Encounter for screening mammogram for malignant neoplasm of breast: Secondary | ICD-10-CM | POA: Diagnosis not present

## 2021-07-05 NOTE — Progress Notes (Signed)
Electrophysiology Office Note:    Date:  07/06/2021   ID:  Leslie, Taylor Apr 24, 1956, MRN [id number]  PCP:  Shirline Frees, MD  [facility] Cardiologist:  Candee Furbish, MD  [facility] Electrophysiologist:  Vickie Epley, MD   Referring MD: Jerline Pain, MD   Chief Complaint: AF  History of Present Illness:    Leslie Taylor is a 65 y.o. female who presents for an evaluation of AF at the request of Dr Marlou Porch. Their medical history includes HTN, AF/AFL, anxiety. The patient last saw Dr Marlou Porch on 05/22/2021. At that appointment she reported more frequent episodes of AF, some lasting up to 1 hour. She reported nuisance nose bleeding as well. Previously took flecainide.  She presents to discuss watchman and ablation to help manage her AF.  The patient confirms the above history today.  She is with her husband today in clinic.    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 ECHO 11/06/10 - normal EF, mild MR/TR   Paraesophageal hiatal hernia 2010   lap repair 2010 with biologic mesh   Resting tremor 05/26/2013    Past Surgical History:  Procedure Laterality Date   CYSTOSCOPY/RETROGRADE/URETEROSCOPY/STONE EXTRACTION WITH BASKET  2003   Cystoscopy and urethral dilation, right retrograde pyelogram with   LAPAROSCOPIC CHOLECYSTECTOMY W/ CHOLANGIOGRAPHY  2010   LAPAROSCOPIC NISSEN FUNDOPLICATION  1660   LAPAROSCOPIC PARAESOPHAGEAL HERNIA REPAIR  2010   primery with biologic mesh reinforcement   SQUAMOUS CELL CARCINOMA EXCISION  2005    Current  Medications: Current Meds  Medication Sig   ALPRAZolam (XANAX) 0.5 MG tablet Take 0.5 mg by mouth at bedtime as needed for anxiety.   apixaban (ELIQUIS) 5 MG TABS tablet Take 1 tablet (5 mg total) by mouth 2 (two) times daily.   CALCIUM PO Take by mouth.   diltiazem (CARDIZEM) 60 MG tablet Take 1 tablet (60 mg total) by mouth 4 (four) times daily as needed.   Magnesium 200 MG TABS 1 capsule (500 mg)   metoprolol succinate (TOPROL-XL) 50 MG 24 hr tablet Take 50 mg by mouth daily.   Multiple Vitamin (MULTIVITAMIN) tablet Take 1 tablet by mouth daily.   traMADol (ULTRAM) 50 MG tablet Take 1-2 tablets (50-100 mg total) by mouth every 8 (eight) hours as needed.   Vitamins A & D 5000-400 units CAPS Take 5,000 Units by mouth daily.     Allergies:   Propranolol hcl, Shellfish-derived products, and Wellbutrin [bupropion]   Social History   Socioeconomic History   Marital status: Married    Spouse name: Not on file   Number of children: Not on file   Years of education: Not on file   Highest education level: Not on file  Occupational History   Not on file  Tobacco Use   Smoking status: Never   Smokeless tobacco: Never  Substance and Sexual Activity   Alcohol use: Yes    Comment: occ   Drug use: No   Sexual activity:  Not on file  Other Topics Concern   Not on file  Social History Narrative   Not on file   Social Determinants of Health   Financial Resource Strain: Not on file  Food Insecurity: Not on file  Transportation Needs: Not on file  Physical Activity: Not on file  Stress: Not on file  Social Connections: Not on file     Family History: The patient's family history includes Cancer in her father.  ROS:   Please see the history of present illness.    All other systems reviewed and are negative.  EKGs/Labs/Other Studies Reviewed:    The following studies were reviewed today:  06/07/2020 echo Normal LV function RV normal LA mildly dilated Mild MR   EKG:  The  ekg ordered today demonstrates sinus tachycardia with a ventricular rate of 102 bpm   Recent Labs: No results found for requested labs within last 8760 hours.  Recent Lipid Panel    Component Value Date/Time   CHOL  03/21/2009 0400    124        ATP III CLASSIFICATION:  <200     mg/dL   Desirable  200-239  mg/dL   Borderline High  >=240    mg/dL   High          TRIG 190 (H) 03/21/2009 0400   HDL 39 (L) 03/14/2009 0515   CHOLHDL 3.7 03/14/2009 0515   VLDL 18 03/14/2009 0515   LDLCALC  03/14/2009 0515    89        Total Cholesterol/HDL:CHD Risk Coronary Heart Disease Risk Table                     Men   Women  1/2 Average Risk   3.4   3.3  Average Risk       5.0   4.4  2 X Average Risk   9.6   7.1  3 X Average Risk  23.4   11.0        Use the calculated Patient Ratio above and the CHD Risk Table to determine the patient's CHD Risk.        ATP III CLASSIFICATION (LDL):  <100     mg/dL   Optimal  100-129  mg/dL   Near or Above                    Optimal  130-159  mg/dL   Borderline  160-189  mg/dL   High  >190     mg/dL   Very High    Physical Exam:    VS:  BP 126/74   Pulse (!) 101   Ht 5' 11.5" (1.816 m)   Wt 204 lb (92.5 kg)   SpO2 98%   BMI 28.06 kg/m     Wt Readings from Last 3 Encounters:  07/06/21 204 lb (92.5 kg)  05/22/21 197 lb (89.4 kg)  11/17/20 200 lb (90.7 kg)     GEN:  Well nourished, well developed in no acute distress HEENT: Normal NECK: No JVD; No carotid bruits LYMPHATICS: No lymphadenopathy CARDIAC: RRR, no murmurs, rubs, gallops RESPIRATORY:  Clear to auscultation without rales, wheezing or rhonchi  ABDOMEN: Soft, non-tender, non-distended MUSCULOSKELETAL:  No edema; No deformity  SKIN: Warm and dry NEUROLOGIC:  Alert and oriented x 3 PSYCHIATRIC:  Normal affect       ASSESSMENT:    1. PAF (paroxysmal atrial fibrillation) ([place])   2. Essential hypertension  3. Bleeding from the nose    PLAN:    In order of problems  listed above:   #Paroxysmal atrial fibrillation Symptomatic and becoming more frequent.  Rhythm control is indicated.  I discussed the rhythm control options with the patient including antiarrhythmic drug therapy and ablation.  I think she is a good candidate for ablation.  I discussed the procedure in detail include the risk, recovery and likelihood of success.  I also discussed the watchman device in detail with the patient and her husband during today's visit given her concerns about recurrent nosebleeds while taking her blood thinner.  She is very interested in the procedure but would like some time to think about both the ablation and watchman implant before scheduling which I think is very reasonable.  She will need a CT scan prior to the procedures to assess the left atrial anatomy.   Risk, benefits, and alternatives to EP study and radiofrequency ablation for afib were also discussed in detail today. These risks include but are not limited to stroke, bleeding, vascular damage, tamponade, perforation, damage to the esophagus, lungs, and other structures, pulmonary vein stenosis, worsening renal function, and death. The patient understands these risks.  ----------  I have seen Leslie Taylor in the office today who is being considered for a Watchman left atrial appendage closure device. I believe they will benefit from this procedure given their history of atrial fibrillation, CHA2DS2-VASc score of 3 and unadjusted ischemic stroke rate of 3.2% per year. Unfortunately, the patient is not felt to be a long term anticoagulation candidate secondary to recurrent nose bleeding. The patient's chart has been reviewed and I feel that they would be a candidate for short term oral anticoagulation after Watchman implant.   It is my belief that after undergoing a LAA closure procedure, JEILANI GRUPE will not need long term anticoagulation which eliminates anticoagulation side effects and major  bleeding risk.   Procedural risks for the Watchman implant have been reviewed with the patient including a 0.5% risk of stroke, <1% risk of perforation and <1% risk of device embolization.    The published clinical data on the safety and effectiveness of WATCHMAN include but are not limited to the following: - Holmes DR, Mechele Claude, Sick P et al. for the PROTECT AF Investigators. Percutaneous closure of the left atrial appendage versus warfarin therapy for prevention of stroke in patients with atrial fibrillation: a randomised non-inferiority trial. Lancet 2009; 374: 534-42. Mechele Claude, Doshi SK, Abelardo Diesel D et al. on behalf of the PROTECT AF Investigators. Percutaneous Left Atrial Appendage Closure for Stroke Prophylaxis in Patients With Atrial Fibrillation 2.3-Year Follow-up of the PROTECT AF (Watchman Left Atrial Appendage System for Embolic Protection in Patients With Atrial Fibrillation) Trial. Circulation 2013; 127:720-729. - Alli O, Doshi S,  Kar S, Reddy VY, Sievert H et al. Quality of Life Assessment in the Randomized PROTECT AF (Percutaneous Closure of the Left Atrial Appendage Versus Warfarin Therapy for Prevention of Stroke in Patients With Atrial Fibrillation) Trial of Patients at Risk for Stroke With Nonvalvular Atrial Fibrillation. J Am Coll Cardiol 2013; 99:3716-9. Vertell Limber DR, Tarri Abernethy, [place] M, Grant, Sievert H, Doshi S, Huber K, Reddy V. Prospective randomized evaluation of the Watchman left atrial appendage Device in patients with atrial fibrillation versus long-term warfarin therapy; the PREVAIL trial. Journal of the [employer] of Cardiology, Vol. 4, No. 1, 2014, 1-11. - Kar S, Doshi SK, Sadhu A, Horton R, Osorio J  et al. Primary outcome evaluation of a next-generation left atrial appendage closure device: results from the PINNACLE FLX trial. Circulation 2021;143(18)1754-1762.    After today's visit with the patient which was dedicated solely for shared decision  making visit regarding LAA closure device, the patient elected to think about it more before scheduling. She will let us know how she would like to proceed.   HAS-BLED score 3 Hypertension Yes  Abnormal renal and liver function (Dialysis, transplant, Cr >2.26 mg/dL /Cirrhosis or Bilirubin >2x Normal or AST/ALT/AP >3x Normal) No  Stroke No  Bleeding Yes  Labile INR (Unstable/high INR) No  Elderly (>65) Yes  Drugs or alcohol (? 8 drinks/week, anti-plt or NSAID) No       Total time spent with patient today 65 minutes. This includes reviewing records, evaluating the patient and coordinating care.  Medication Adjustments/Labs and Tests Ordered: Current medicines are reviewed at length with the patient today.  Concerns regarding medicines are outlined above.  Orders Placed This Encounter  Procedures   EKG 12-Lead    No orders of the defined types were placed in this encounter.    Signed, Hilton Cork. Quentin Ore, MD, [facility], [facility] 07/06/2021 11:06 AM    Electrophysiology [facility]

## 2021-07-06 ENCOUNTER — Encounter: Payer: Self-pay | Admitting: Cardiology

## 2021-07-06 ENCOUNTER — Other Ambulatory Visit: Payer: Self-pay

## 2021-07-06 ENCOUNTER — Ambulatory Visit: Payer: Medicare Other | Admitting: Cardiology

## 2021-07-06 VITALS — BP 126/74 | HR 101 | Ht 71.5 in | Wt 204.0 lb

## 2021-07-06 DIAGNOSIS — I10 Essential (primary) hypertension: Secondary | ICD-10-CM | POA: Diagnosis not present

## 2021-07-06 DIAGNOSIS — R040 Epistaxis: Secondary | ICD-10-CM | POA: Diagnosis not present

## 2021-07-06 DIAGNOSIS — I480 Paroxysmal atrial fibrillation: Secondary | ICD-10-CM

## 2021-07-06 NOTE — Patient Instructions (Addendum)
Medication Instructions:  Your physician recommends that you continue on your current medications as directed. Please refer to the Current Medication list given to you today. *If you need a refill on your cardiac medications before your next appointment, please call your pharmacy*  Lab Work: None ordered. If you have labs (blood work) drawn today and your tests are completely normal, you will receive your results only by: [place] (if you have MyChart) OR A paper copy in the mail If you have any lab test that is abnormal or we need to change your treatment, we will call you to review the results.  Testing/Procedures: None ordered.  Follow-Up: At [facility], you and your health needs are our priority.  As part of our continuing mission to provide you with exceptional heart care, we have created designated Provider Care Teams.  These Care Teams include your primary Cardiologist (physician) and Advanced Practice Providers (APPs -  Physician Assistants and Nurse Practitioners) who all work together to provide you with the care you need, when you need it.  Your next appointment:   Your physician wants you to follow-up in: Please contact me if you would like to schedule an ablation or watchman implant.  Sonia Baller RN

## 2021-09-22 ENCOUNTER — Telehealth: Payer: Self-pay | Admitting: Cardiology

## 2021-09-22 DIAGNOSIS — I4891 Unspecified atrial fibrillation: Secondary | ICD-10-CM

## 2021-09-22 DIAGNOSIS — Z01818 Encounter for other preprocedural examination: Secondary | ICD-10-CM

## 2021-09-22 NOTE — Telephone Encounter (Signed)
Patient is requesting a call back to schedule her ablation and watchman procedure. Please return call as able.

## 2021-10-10 NOTE — Telephone Encounter (Signed)
Patient returned call

## 2021-10-11 NOTE — Telephone Encounter (Signed)
° °  Pt is calling back to speak with Otila Kluver to schedule her procedure

## 2021-10-12 NOTE — Telephone Encounter (Signed)
Left message requesting call back to THIS nurse to schedule afib ablation.  Please transfer to this nurse if Pt calls back 10/12/2021

## 2021-10-12 NOTE — Telephone Encounter (Signed)
Call back received from Leslie Taylor.  Leslie Taylor agreeable to afib ablation date of December 15, 2021 at 1:30 pm (this has been scheduled with cath lab)  She would like lab work on December 01, 2021.  Will need to complete work up.  Per Clarise Cruz CT nurse navigator-have Leslie Taylor hold Toprol day of CT and instead take metoprolol tartrate 100 mg 2 hours prior to CT.  Leslie Taylor aware of instructions.  Need to complete instruction letters and MAIL per Leslie Taylor request.  ---Leslie Taylor concerned that she has a few loose teeth.  These came loose from prior intubation.  She is requesting this be communicated to Dr. Quentin Ore for his advisement.  (Please complete work up)

## 2021-10-16 NOTE — Telephone Encounter (Signed)
If she has loose teeth I would recommend she be seen by her dentist to exclude any infections and to see if they need to be treated.  CL   Left detailed message about Dr. Mardene Speak recommendations.

## 2021-10-19 ENCOUNTER — Encounter: Payer: Self-pay | Admitting: *Deleted

## 2021-10-19 MED ORDER — METOPROLOL TARTRATE 100 MG PO TABS: 100.0000 mg | ORAL_TABLET | Freq: Once | ORAL | 0 refills | Status: DC | PRN

## 2021-10-19 NOTE — Telephone Encounter (Signed)
Workup completed for ablation. Letters mailed and sent over [place].   Echo ordered for Watchman workup.   Patient called and made aware, answered any questions.  Patient verbalized understanding.

## 2021-11-10 ENCOUNTER — Other Ambulatory Visit: Payer: Self-pay

## 2021-11-10 ENCOUNTER — Ambulatory Visit (HOSPITAL_COMMUNITY): Payer: Medicare Other | Attending: Cardiology

## 2021-11-10 DIAGNOSIS — Z01818 Encounter for other preprocedural examination: Secondary | ICD-10-CM | POA: Diagnosis not present

## 2021-11-10 DIAGNOSIS — I4891 Unspecified atrial fibrillation: Secondary | ICD-10-CM | POA: Diagnosis not present

## 2021-11-10 LAB — ECHOCARDIOGRAM COMPLETE
Area-P 1/2: 3.85 cm2
S' Lateral: 3 cm

## 2021-11-24 ENCOUNTER — Other Ambulatory Visit: Payer: Self-pay | Admitting: Cardiology

## 2021-12-01 ENCOUNTER — Other Ambulatory Visit: Payer: Medicare Other | Admitting: *Deleted

## 2021-12-01 DIAGNOSIS — Z01818 Encounter for other preprocedural examination: Secondary | ICD-10-CM | POA: Diagnosis not present

## 2021-12-01 DIAGNOSIS — I4891 Unspecified atrial fibrillation: Secondary | ICD-10-CM | POA: Diagnosis not present

## 2021-12-01 LAB — BASIC METABOLIC PANEL
BUN/Creatinine Ratio: 13 (ref 12–28)
BUN: 11 mg/dL (ref 8–27)
CO2: 26 mmol/L (ref 20–29)
Calcium: 9.5 mg/dL (ref 8.7–10.3)
Chloride: 103 mmol/L (ref 96–106)
Creatinine, Ser: 0.86 mg/dL (ref 0.57–1.00)
Glucose: 85 mg/dL (ref 70–99)
Potassium: 4 mmol/L (ref 3.5–5.2)
Sodium: 142 mmol/L (ref 134–144)
eGFR: 74 mL/min/{1.73_m2} (ref >59)

## 2021-12-01 LAB — CBC WITH DIFFERENTIAL/PLATELET
Basophils Absolute: 0.1 10*3/uL (ref 0.0–0.2)
Basos: 1 %
EOS (ABSOLUTE): 0.3 10*3/uL (ref 0.0–0.4)
Eos: 5 %
Hematocrit: 36.1 % (ref 34.0–46.6)
Hemoglobin: 12.1 g/dL (ref 11.1–15.9)
Immature Grans (Abs): 0 10*3/uL (ref 0.0–0.1)
Immature Granulocytes: 0 %
Lymphocytes Absolute: 1.4 10*3/uL (ref 0.7–3.1)
Lymphs: 26 %
MCH: 29.9 pg (ref 26.6–33.0)
MCHC: 33.5 g/dL (ref 31.5–35.7)
MCV: 89 fL (ref 79–97)
Monocytes Absolute: 0.5 10*3/uL (ref 0.1–0.9)
Monocytes: 9 %
Neutrophils Absolute: 3.2 10*3/uL (ref 1.4–7.0)
Neutrophils: 59 %
Platelets: 245 10*3/uL (ref 150–450)
RBC: 4.05 x10E6/uL (ref 3.77–5.28)
RDW: 12.1 % (ref 11.7–15.4)
WBC: 5.4 10*3/uL (ref 3.4–10.8)

## 2021-12-05 ENCOUNTER — Telehealth (HOSPITAL_COMMUNITY): Payer: Self-pay | Admitting: *Deleted

## 2021-12-05 NOTE — Telephone Encounter (Signed)
Attempted to call patient regarding upcoming cardiac CT appointment. °Left message on voicemail with name and callback number ° °Tareka Jhaveri RN Navigator Cardiac Imaging °[facility] °[phone] Office °[phone] Cell ° °

## 2021-12-05 NOTE — Telephone Encounter (Signed)
Reaching out to patient to offer assistance regarding upcoming cardiac imaging study; pt verbalizes understanding of appt date/time, parking situation and where to check in, pre-test NPO status and medications ordered; name and call back number provided for further questions should they arise ? ?Leslie Clement RN Navigator Cardiac Imaging ?[facility] ?[phone] office ?[phone] cell ? ?Patient to take '100mg'$  metoprolol tartrate two prior to her cardiac CT scan.  She is aware to arrive at 7:30am. ?

## 2021-12-07 ENCOUNTER — Ambulatory Visit (HOSPITAL_COMMUNITY)
Admission: RE | Admit: 2021-12-07 | Discharge: 2021-12-07 | Disposition: A | Discharge status: Home / Self Care | Payer: Medicare Other | Source: Ambulatory Visit | Attending: Cardiology | Admitting: Cardiology

## 2021-12-07 DIAGNOSIS — I4891 Unspecified atrial fibrillation: Secondary | ICD-10-CM | POA: Diagnosis not present

## 2021-12-07 MED ORDER — IOHEXOL 350 MG/ML SOLN
100.0000 mL | Freq: Once | INTRAVENOUS | Status: AC | PRN
  Administered 2021-12-07: 100 mL via INTRAVENOUS

## 2021-12-11 ENCOUNTER — Other Ambulatory Visit: Payer: Self-pay

## 2021-12-11 MED ORDER — APIXABAN 5 MG PO TABS: 5.0000 mg | ORAL_TABLET | Freq: Two times a day (BID) | ORAL | 1 refills | Status: DC

## 2021-12-11 NOTE — Telephone Encounter (Signed)
Eliquis 5 mg refill request received. Patient is 66 years old, weight- 92.5 kg, Crea- 0.86 on 07/06/21, Diagnosis- PAF, and last seen by Dr. Quentin Ore on 07/06/21. Dose is appropriate based on dosing criteria. Will send in refill to requested pharmacy.   ?

## 2021-12-13 NOTE — Pre-Procedure Instructions (Signed)
Instructed patient on the following items: ?Arrival time 1230 ?Nothing to eat or drink after midnight ?No meds AM of procedure ?Responsible person to drive you home and stay with you for 24 hrs ? ?Have you missed any doses of anti-coagulant Eliquis- hasn't missed any doses ?   ?

## 2021-12-15 ENCOUNTER — Ambulatory Visit (HOSPITAL_COMMUNITY): Payer: Medicare Other | Admitting: Anesthesiology

## 2021-12-15 ENCOUNTER — Other Ambulatory Visit: Payer: Self-pay

## 2021-12-15 ENCOUNTER — Ambulatory Visit (HOSPITAL_BASED_OUTPATIENT_CLINIC_OR_DEPARTMENT_OTHER): Payer: Medicare Other | Admitting: Anesthesiology

## 2021-12-15 ENCOUNTER — Ambulatory Visit (HOSPITAL_COMMUNITY)
Admission: RE | Admit: 2021-12-15 | Discharge: 2021-12-16 | Disposition: A | Discharge status: Home / Self Care | Payer: Medicare Other | Attending: Cardiology | Admitting: Cardiology

## 2021-12-15 ENCOUNTER — Encounter (HOSPITAL_COMMUNITY)
Admission: RE | Disposition: A | Discharge status: Home / Self Care | Payer: Medicare Other | Source: Home / Self Care | Attending: Cardiology

## 2021-12-15 ENCOUNTER — Encounter (HOSPITAL_COMMUNITY): Payer: Self-pay | Admitting: Cardiology

## 2021-12-15 DIAGNOSIS — Z79899 Other long term (current) drug therapy: Secondary | ICD-10-CM | POA: Insufficient documentation

## 2021-12-15 DIAGNOSIS — I4891 Unspecified atrial fibrillation: Secondary | ICD-10-CM | POA: Diagnosis present

## 2021-12-15 DIAGNOSIS — Z8719 Personal history of other diseases of the digestive system: Secondary | ICD-10-CM | POA: Insufficient documentation

## 2021-12-15 DIAGNOSIS — I10 Essential (primary) hypertension: Secondary | ICD-10-CM | POA: Insufficient documentation

## 2021-12-15 DIAGNOSIS — R251 Tremor, unspecified: Secondary | ICD-10-CM | POA: Diagnosis not present

## 2021-12-15 DIAGNOSIS — I480 Paroxysmal atrial fibrillation: Secondary | ICD-10-CM | POA: Diagnosis not present

## 2021-12-15 DIAGNOSIS — F419 Anxiety disorder, unspecified: Secondary | ICD-10-CM | POA: Diagnosis not present

## 2021-12-15 HISTORY — PX: ATRIAL FIBRILLATION ABLATION: EP1191

## 2021-12-15 LAB — POCT ACTIVATED CLOTTING TIME
Activated Clotting Time: 233 seconds
Activated Clotting Time: 281 seconds
Activated Clotting Time: 329 seconds

## 2021-12-15 SURGERY — ATRIAL FIBRILLATION ABLATION
Anesthesia: General

## 2021-12-15 MED ORDER — ONDANSETRON HCL 4 MG/2ML IJ SOLN
4.0000 mg | Freq: Four times a day (QID) | INTRAMUSCULAR | Status: DC | PRN
Start: 2021-12-15 — End: 2021-12-16

## 2021-12-15 MED ORDER — HEPARIN SODIUM (PORCINE) 1000 UNIT/ML IJ SOLN
INTRAMUSCULAR | Status: DC | PRN
  Administered 2021-12-15: 1000 [IU] via INTRAVENOUS

## 2021-12-15 MED ORDER — HEPARIN SODIUM (PORCINE) 1000 UNIT/ML IJ SOLN
INTRAMUSCULAR | Status: AC
  Filled 2021-12-15: qty 10

## 2021-12-15 MED ORDER — HEPARIN (PORCINE) IN NACL 1000-0.9 UT/500ML-% IV SOLN
INTRAVENOUS | Status: AC
  Filled 2021-12-15: qty 500

## 2021-12-15 MED ORDER — SUGAMMADEX SODIUM 200 MG/2ML IV SOLN
INTRAVENOUS | Status: DC | PRN
  Administered 2021-12-15: 200 mg via INTRAVENOUS

## 2021-12-15 MED ORDER — ISOPROTERENOL HCL 0.2 MG/ML IJ SOLN
INTRAMUSCULAR | Status: AC
  Filled 2021-12-15: qty 5

## 2021-12-15 MED ORDER — DEXAMETHASONE SODIUM PHOSPHATE 10 MG/ML IJ SOLN
INTRAMUSCULAR | Status: DC | PRN
  Administered 2021-12-15: 5 mg via INTRAVENOUS

## 2021-12-15 MED ORDER — HEPARIN (PORCINE) IN NACL 1000-0.9 UT/500ML-% IV SOLN
INTRAVENOUS | Status: DC | PRN
  Administered 2021-12-15 (×4): 500 mL

## 2021-12-15 MED ORDER — PHENYLEPHRINE HCL-NACL 20-0.9 MG/250ML-% IV SOLN
INTRAVENOUS | Status: DC | PRN
Start: 2021-12-15 — End: 2021-12-16
  Administered 2021-12-15: 15 ug/min via INTRAVENOUS

## 2021-12-15 MED ORDER — PROTAMINE SULFATE 10 MG/ML IV SOLN
INTRAVENOUS | Status: DC | PRN
Start: 2021-12-15 — End: 2021-12-16
  Administered 2021-12-15: 35 mg via INTRAVENOUS

## 2021-12-15 MED ORDER — MENTHOL 3 MG MT LOZG
1.0000 | LOZENGE | OROMUCOSAL | Status: DC | PRN
  Administered 2021-12-15: 3 mg via ORAL
  Filled 2021-12-15: qty 9

## 2021-12-15 MED ORDER — MIDAZOLAM HCL 2 MG/2ML IJ SOLN
INTRAMUSCULAR | Status: DC | PRN
  Administered 2021-12-15: 2 mg via INTRAVENOUS

## 2021-12-15 MED ORDER — ROCURONIUM BROMIDE 10 MG/ML (PF) SYRINGE
PREFILLED_SYRINGE | INTRAVENOUS | Status: DC | PRN
  Administered 2021-12-15: 100 mg via INTRAVENOUS

## 2021-12-15 MED ORDER — ONDANSETRON HCL 4 MG/2ML IJ SOLN
INTRAMUSCULAR | Status: DC | PRN
  Administered 2021-12-15: 4 mg via INTRAVENOUS

## 2021-12-15 MED ORDER — LIDOCAINE 2% (20 MG/ML) 5 ML SYRINGE
INTRAMUSCULAR | Status: DC | PRN
Start: 2021-12-15 — End: 2021-12-16
  Administered 2021-12-15: 100 mg via INTRAVENOUS

## 2021-12-15 MED ORDER — ACETAMINOPHEN 325 MG PO TABS
650.0000 mg | ORAL_TABLET | ORAL | Status: DC | PRN
  Administered 2021-12-15 (×2): 650 mg via ORAL
  Filled 2021-12-15 (×2): qty 2

## 2021-12-15 MED ORDER — MENTHOL 3 MG MT LOZG: 1.0000 | LOZENGE | OROMUCOSAL | Status: DC | PRN

## 2021-12-15 MED ORDER — PHENYLEPHRINE 40 MCG/ML (10ML) SYRINGE FOR IV PUSH (FOR BLOOD PRESSURE SUPPORT)
PREFILLED_SYRINGE | INTRAVENOUS | Status: DC | PRN
  Administered 2021-12-15: 80 ug via INTRAVENOUS

## 2021-12-15 MED ORDER — SODIUM CHLORIDE 0.9 % IV SOLN: INTRAVENOUS | Status: DC

## 2021-12-15 MED ORDER — HEPARIN SODIUM (PORCINE) 1000 UNIT/ML IJ SOLN
INTRAMUSCULAR | Status: DC | PRN
  Administered 2021-12-15: 7000 [IU] via INTRAVENOUS
  Administered 2021-12-15: 6000 [IU] via INTRAVENOUS
  Administered 2021-12-15: 13000 [IU] via INTRAVENOUS

## 2021-12-15 MED ORDER — FENTANYL CITRATE (PF) 100 MCG/2ML IJ SOLN
INTRAMUSCULAR | Status: DC | PRN
  Administered 2021-12-15 (×2): 50 ug via INTRAVENOUS

## 2021-12-15 MED ORDER — ISOPROTERENOL HCL 0.2 MG/ML IJ SOLN
INTRAVENOUS | Status: DC | PRN
  Administered 2021-12-15: 2 ug/min via INTRAVENOUS

## 2021-12-15 MED ORDER — APIXABAN 5 MG PO TABS
5.0000 mg | ORAL_TABLET | Freq: Two times a day (BID) | ORAL | Status: DC
  Administered 2021-12-15 – 2021-12-16 (×2): 5 mg via ORAL
  Filled 2021-12-15 (×3): qty 1

## 2021-12-15 MED ORDER — PROPOFOL 10 MG/ML IV BOLUS
INTRAVENOUS | Status: DC | PRN
  Administered 2021-12-15: 150 mg via INTRAVENOUS

## 2021-12-15 MED ORDER — METOPROLOL SUCCINATE ER 50 MG PO TB24
50.0000 mg | ORAL_TABLET | Freq: Every day | ORAL | Status: DC
  Administered 2021-12-15: 50 mg via ORAL
  Filled 2021-12-15 (×2): qty 1

## 2021-12-15 MED ORDER — ACETAMINOPHEN 325 MG PO TABS
ORAL_TABLET | ORAL | Status: AC
Start: 2021-12-15 — End: ?
  Filled 2021-12-15: qty 2

## 2021-12-15 SURGICAL SUPPLY — 19 items
CATH 8FR REPROCESSED SOUNDSTAR (CATHETERS) ×2 IMPLANT
CATH 8FR SOUNDSTAR REPROCESSED (CATHETERS) IMPLANT
CATH MAPPNG PENTARAY F 2-6-2MM (CATHETERS) IMPLANT
CATH S CIRCA THERM PROBE 10F (CATHETERS) ×1 IMPLANT
CATH SMTCH THERMOCOOL SF DF (CATHETERS) ×1 IMPLANT
CATH WEBSTER BI DIR CS D-F CRV (CATHETERS) ×1 IMPLANT
CLOSURE PERCLOSE PROSTYLE (VASCULAR PRODUCTS) ×3 IMPLANT
COVER SWIFTLINK CONNECTOR (BAG) ×2 IMPLANT
PACK EP LATEX FREE (CUSTOM PROCEDURE TRAY) ×2
PACK EP LF (CUSTOM PROCEDURE TRAY) ×1 IMPLANT
PAD DEFIB RADIO PHYSIO CONN (PAD) ×2 IMPLANT
PATCH CARTO3 (PAD) ×2 IMPLANT
PENTARAY F 2-6-2MM (CATHETERS) ×2
SHEATH BAYLIS TRANSSEPTAL 98CM (NEEDLE) ×1 IMPLANT
SHEATH CARTO VIZIGO SM CVD (SHEATH) ×1 IMPLANT
SHEATH PINNACLE 8F 10CM (SHEATH) ×2 IMPLANT
SHEATH PINNACLE 9F 10CM (SHEATH) ×1 IMPLANT
SHEATH PROBE COVER 6X72 (BAG) ×1 IMPLANT
TUBING SMART ABLATE COOLFLOW (TUBING) ×1 IMPLANT

## 2021-12-15 NOTE — Anesthesia Preprocedure Evaluation (Addendum)
Anesthesia Evaluation  ? ? ?Reviewed: ?Allergy & Precautions, Patient's Chart, lab work & pertinent test results, reviewed documented beta blocker date and time  ? ?Airway ?Mallampati: II ? ?TM Distance: >3 FB ?Neck ROM: Full ? ? ? Dental ?no notable dental hx. ? ?  ?Pulmonary ?neg pulmonary ROS,  ?  ?Pulmonary exam normal ?breath sounds clear to auscultation ? ? ? ? ? ? Cardiovascular ?hypertension, Pt. on medications and Pt. on home beta blockers ?Normal cardiovascular exam+ dysrhythmias (eliquis) Atrial Fibrillation  ?Rhythm:Regular Rate:Normal ? ? ?  ?Neuro/Psych ?PSYCHIATRIC DISORDERS Anxiety  Neuromuscular disease (resting tremor)   ? GI/Hepatic ?Neg liver ROS, hiatal hernia,   ?Endo/Other  ?negative endocrine ROS ? Renal/GU ?negative Renal ROS  ?negative genitourinary ?  ?Musculoskeletal ?negative musculoskeletal ROS ?(+)  ? Abdominal ?  ?Peds ? Hematology ?negative hematology ROS ?(+)   ?Anesthesia Other Findings ? ? Reproductive/Obstetrics ?negative OB ROS ? ?  ? ? ? ? ? ? ? ? ? ? ? ? ? ?  ?  ? ? ? ? ? ? ? ?Anesthesia Physical ?Anesthesia Plan ? ?ASA: 3 ? ?Anesthesia Plan: General  ? ?Post-op Pain Management:   ? ?Induction: Intravenous ? ?PONV Risk Score and Plan: 3 and Ondansetron, Dexamethasone, Midazolam and Treatment may vary due to age or medical condition ? ?Airway Management Planned: Oral ETT ? ?Additional Equipment: None ? ?Intra-op Plan:  ? ?Post-operative Plan: Extubation in OR ? ?Informed Consent:  ? ?Plan Discussed with:  ? ?Anesthesia Plan Comments:   ? ? ? ? ? ? ?Anesthesia Quick Evaluation ? ?

## 2021-12-15 NOTE — Anesthesia Procedure Notes (Signed)
Procedure Name: Intubation ?Date/Time: 12/15/2021 2:57 PM ?Performed by: Barrington Ellison, CRNA ?Pre-anesthesia Checklist: Patient identified, Emergency Drugs available, Suction available and Patient being monitored ?Patient Re-evaluated:Patient Re-evaluated prior to induction ?Oxygen Delivery Method: Circle System Utilized ?Preoxygenation: Pre-oxygenation with 100% oxygen ?Induction Type: IV induction ?Ventilation: Mask ventilation without difficulty ?Laryngoscope Size: Glidescope and 3 ?Grade View: Grade II ?Tube type: Oral ?Tube size: 7.0 mm ?Number of attempts: 3 ?Airway Equipment and Method: Oral airway, Video-laryngoscopy, Rigid stylet and Bougie stylet ?Placement Confirmation: ETT inserted through vocal cords under direct vision, positive ETCO2 and breath sounds checked- equal and bilateral ?Secured at: 21 cm ?Tube secured with: Tape ?Dental Injury: Teeth and Oropharynx as per pre-operative assessment  ?Difficulty Due To: Difficulty was anticipated, Difficult Airway- due to anterior larynx, Difficult Airway- due to dentition and Difficult Airway- due to limited oral opening ?Future Recommendations: Recommend- induction with short-acting agent, and alternative techniques readily available ?Comments: DL x 1 with Mac 3- Grade IV view, attempted Bougie, unable to pass it, DL x 1 with Glidescope, Grade 2 view, Grade 1 only with cricoid pressure, "hidden tooth" on bottom front also inhibits ability to view, Strongly recommend video only intubations. ? ? ? ? ?

## 2021-12-15 NOTE — H&P (Signed)
?Electrophysiology Office Note:   ?  ?Date:  12/15/2021  ?  ?ID:  Leslie BACIGALUPO, DOB 08-12-1956, MRN [id number] ?  ?PCP:  Shirline Frees, MD   ?[facility] Cardiologist:  Candee Furbish, MD  ?[facility] Electrophysiologist:  Vickie Epley, MD  ?  ?Referring MD: Jerline Pain, MD  ?  ?Chief Complaint: AF ?  ?History of Present Illness:   ?  ?Leslie Taylor is a 66 y.o. female who presents for an evaluation of AF at the request of Dr Marlou Porch. Their medical history includes HTN, AF/AFL, anxiety. ?The patient last saw Dr Marlou Porch on 05/22/2021. ?At that appointment she reported more frequent episodes of AF, some lasting up to 1 hour. She reported nuisance nose bleeding as well. Previously took flecainide. ?  ?She presents to discuss watchman and ablation to help manage her AF. ?  ?The patient confirms the above history today.  She is with her husband today in clinic. ?  ?Here for PVI today. Procedure reviewed. No problems with [place]. ?  ?Objective  ?  ?    ?Past Medical History:  ?Diagnosis Date  ? Anemia    ? Anxiety with possible prior panic attacks in the past 05/26/2013  ? Cancer [facility])    ?  skin  ? Chest pain, atypical 05/26/2013  ? Choledocholithiasis 2010  ?  ERCP Dr Benson [place] 04/2009  ? 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  Followed by [facility]   ? Dysphagia 05/26/2013  ? Hypertension    ? Nephrolithiasis 2003  ?  removed w stent  ? PAF (paroxysmal atrial fibrillation) ([place])    ?  11/06/10 - NUC stress - low risk, no ischemia. ECHO 11/06/10 - normal EF, mild MR/TR  ? Paraesophageal hiatal hernia 2010  ?  lap repair 2010 with biologic mesh  ? Resting tremor 05/26/2013  ?  ?  ?     ?Past Surgical History:  ?Procedure Laterality Date  ? CYSTOSCOPY/RETROGRADE/URETEROSCOPY/STONE EXTRACTION WITH BASKET   2003  ?  Cystoscopy and urethral dilation, right retrograde pyelogram with  ? LAPAROSCOPIC CHOLECYSTECTOMY W/ CHOLANGIOGRAPHY   2010  ? LAPAROSCOPIC NISSEN FUNDOPLICATION   9379  ? LAPAROSCOPIC PARAESOPHAGEAL HERNIA REPAIR    2010  ?  primery with biologic mesh reinforcement  ? SQUAMOUS CELL CARCINOMA EXCISION   2005  ?  ?  ?Current Medications: ?Active Medications  ?    ?Current Meds  ?Medication Sig  ? ALPRAZolam (XANAX) 0.5 MG tablet Take 0.5 mg by mouth at bedtime as needed for anxiety.  ? apixaban (ELIQUIS) 5 MG TABS tablet Take 1 tablet (5 mg total) by mouth 2 (two) times daily.  ? CALCIUM PO Take by mouth.  ? diltiazem (CARDIZEM) 60 MG tablet Take 1 tablet (60 mg total) by mouth 4 (four) times daily as needed.  ? Magnesium 200 MG TABS 1 capsule (500 mg)  ? metoprolol succinate (TOPROL-XL) 50 MG 24 hr tablet Take 50 mg by mouth daily.  ? Multiple Vitamin (MULTIVITAMIN) tablet Take 1 tablet by mouth daily.  ? traMADol (ULTRAM) 50 MG tablet Take 1-2 tablets (50-100 mg total) by mouth every 8 (eight) hours as needed.  ? Vitamins A & D 5000-400 units CAPS Take 5,000 Units by mouth daily.  ?  ?  ?  ?Allergies:   Propranolol hcl, Shellfish-derived products, and Wellbutrin [bupropion]  ?  ?Social History  ?  ?     ?Socioeconomic History  ? Marital status: Married  ?  Spouse name: Not on file  ? Number of children: Not on file  ? Years of education: Not on file  ? Highest education level: Not on file  ?Occupational History  ? Not on file  ?Tobacco Use  ? Smoking status: Never  ? Smokeless tobacco: Never  ?Substance and Sexual Activity  ? Alcohol use: Yes  ?    Comment: occ  ? Drug use: No  ? Sexual activity: Not on file  ?Other Topics Concern  ? Not on file  ?Social History Narrative  ? Not on file  ?  ?Social Determinants of Health  ?  ?Financial Resource Strain: Not on file  ?Food Insecurity: Not on file  ?Transportation Needs: Not on file  ?Physical Activity: Not on file  ?Stress: Not on file  ?Social Connections: Not on file  ?  ?  ?Family History: ?The patient's family history includes Cancer in her father. ?  ?ROS:   ?Please see the history of present illness.    ?All other systems reviewed and are negative. ?  ?EKGs/Labs/Other  Studies Reviewed:   ?  ?The following studies were reviewed today: ?  ?06-23-2020 echo ?Normal LV function ?RV normal ?LA mildly dilated ?Mild MR ?  ?  ?EKG:  The ekg ordered today demonstrates sinus tachycardia with a ventricular rate of 102 bpm ?  ?  ?Recent Labs: ?No results found for requested labs within last 8760 hours.  ?Recent Lipid Panel ?Labs (Brief)  ?      ?   ?Component Value Date/Time  ?  CHOL   03/21/2009 0400  ?    124        ?ATP III CLASSIFICATION: ? <200     mg/dL   Desirable ? 200-239  mg/dL   Borderline High ? >=240    mg/dL   High ?        ?  TRIG 190 (H) 03/21/2009 0400  ?  HDL 39 (L) 03/14/2009 0515  ?  CHOLHDL 3.7 03/14/2009 0515  ?  VLDL 18 03/14/2009 0515  ?  [place]   03/14/2009 0515  ?    89        ?Total Cholesterol/HDL:CHD Risk ?Coronary Heart Disease Risk Table ?                    Men   Women ? 1/2 Average Risk   3.4   3.3 ? Average Risk       5.0   4.4 ? 2 X Average Risk   9.6   7.1 ? 3 X Average Risk  23.4   11.0 ?       ?Use the calculated Patient Ratio ?above and the CHD Risk Table ?to determine the patient's CHD Risk. ?       ?ATP III CLASSIFICATION (LDL): ? <100     mg/dL   Optimal ? 100-129  mg/dL   Near or Above ?                   Optimal ? 130-159  mg/dL   Borderline ? 160-189  mg/dL   High ? >190     mg/dL   Very High  ?  ?  ?  ?Physical Exam:   ?  ?VS:  BP 137/88   Pulse (!) 84   Ht 5' 11.5" (1.816 m)   Wt 204 lb (92.5 kg)   SpO2 98%   BMI 28.06 kg/m?    ?  ?   ?  Wt Readings from Last 3 Encounters:  ?07/06/21 204 lb (92.5 kg)  ?05/22/21 197 lb (89.4 kg)  ?11/17/20 200 lb (90.7 kg)  ?  ?  ?GEN:  Well nourished, well developed in no acute distress ?HEENT: Normal ?NECK: No JVD; No carotid bruits ?LYMPHATICS: No lymphadenopathy ?CARDIAC: RRR, no murmurs, rubs, gallops ?RESPIRATORY:  Clear to auscultation without rales, wheezing or rhonchi  ?ABDOMEN: Soft, non-tender, non-distended ?MUSCULOSKELETAL:  No edema; No deformity  ?SKIN: Warm and dry ?NEUROLOGIC:  Alert and  oriented x 3 ?PSYCHIATRIC:  Normal affect  ?  ?  ?  ?  ?Assessment ?  ?  ?ASSESSMENT:   ?  ?1. PAF (paroxysmal atrial fibrillation) ([place])   ?2. Essential hypertension   ?3. Bleeding from the nose   ?  ?PLAN:   ?  ?In order of problems listed above: ?  ?  ?#Paroxysmal atrial fibrillation ?Symptomatic and becoming more frequent.  Rhythm control is indicated.  I discussed the rhythm control options with the patient including antiarrhythmic drug therapy and ablation.  I think she is a good candidate for ablation.  I discussed the procedure in detail include the risk, recovery and likelihood of success. ? ? ? ?Risk, benefits, and alternatives to EP study and radiofrequency ablation for afib were also discussed in detail today. These risks include but are not limited to stroke, bleeding, vascular damage, tamponade, perforation, damage to the esophagus, lungs, and other structures, pulmonary vein stenosis, worsening renal function, and death. The patient understands these risk and wishes to proceed.  We will therefore proceed with catheter ablation at the next available time.  [place]o, ICE, anesthesia are requested for the procedure.   ?  ?  ?Signed, ?Lysbeth Galas T. Quentin Ore, MD, [facility], [place] ?07/06/2021 11:06 AM    ?Electrophysiology ?[place] ?  ?  ?

## 2021-12-15 NOTE — Discharge Instructions (Signed)
Post procedure care instructions ?No driving for 4 days. No lifting over 5 lbs for 1 week. No vigorous or sexual activity for 1 week. You may return to work/your usual activities on 12/23/21. Keep procedure site clean & dry. If you notice increased pain, swelling, bleeding or pus, call/return!  You may shower after 24 hours, but no soaking in baths/hot tubs/pools for 1 week.  ? ? ? ?You have an appointment set up with the [facility].  Multiple studies have shown that being followed by a dedicated atrial fibrillation clinic in addition to the standard care you receive from your other physicians improves health. We believe that enrollment in the atrial fibrillation clinic will allow [place] to better care for you.  ? ?The phone number to the [facility] is [phone]. The clinic is staffed Monday through Friday from 8:30am to 5pm. ? ?Parking Directions: The clinic is located in the Heart and Vascular Building connected to [facility]. ?1)From [address] turn on to [address] and go to the 3rd entrance  (Heart and Vascular entrance) on the right. ?2)Look to the right for Heart &Vascular Parking Garage. ?3)A code for the entrance is required, for May is 1002.   ?4)Take the elevators to the 1st floor. Registration is in the room with the glass walls at the end of the hallway. ? ?If you have any trouble parking or locating the clinic, please don?t hesitate to call [phone].  ?

## 2021-12-16 DIAGNOSIS — I480 Paroxysmal atrial fibrillation: Secondary | ICD-10-CM

## 2021-12-16 DIAGNOSIS — F419 Anxiety disorder, unspecified: Secondary | ICD-10-CM | POA: Diagnosis not present

## 2021-12-16 DIAGNOSIS — I10 Essential (primary) hypertension: Secondary | ICD-10-CM | POA: Diagnosis not present

## 2021-12-16 DIAGNOSIS — R251 Tremor, unspecified: Secondary | ICD-10-CM | POA: Diagnosis not present

## 2021-12-16 DIAGNOSIS — Z79899 Other long term (current) drug therapy: Secondary | ICD-10-CM | POA: Diagnosis not present

## 2021-12-16 DIAGNOSIS — Z8719 Personal history of other diseases of the digestive system: Secondary | ICD-10-CM | POA: Diagnosis not present

## 2021-12-16 MED ORDER — COLCHICINE 0.6 MG PO TABS: 0.6000 mg | ORAL_TABLET | Freq: Two times a day (BID) | ORAL | 0 refills | Status: DC

## 2021-12-16 MED ORDER — PANTOPRAZOLE SODIUM 40 MG PO TBEC: 40.0000 mg | DELAYED_RELEASE_TABLET | Freq: Every day | ORAL | 0 refills | Status: DC

## 2021-12-16 NOTE — Transfer of Care (Signed)
Immediate Anesthesia Transfer of Care Note ? ?Patient: Leslie Taylor ? ?Procedure(s) Performed: ATRIAL FIBRILLATION ABLATION ? ?Patient Location: Cath Lab ? ?Anesthesia Type:General ? ?Level of Consciousness: awake, alert  and oriented ? ?Airway & Oxygen Therapy: Patient Spontanous Breathing ? ?Post-op Assessment: Report given to RN ? ?Post vital signs: Reviewed and stable ? ?Last Vitals:  ?Vitals Value Taken Time  ?BP 123/65 12/16/21 0759  ?Temp 36.5 ?C 12/16/21 0759  ?Pulse 101 12/16/21 0830  ?Resp 17 12/16/21 0759  ?SpO2 93 % 12/16/21 0830  ?Vitals shown include unvalidated device data. ? ?Last Pain:  ?Vitals:  ? 12/16/21 0759  ?TempSrc: Oral  ?PainSc:   ?   ? ?Patients Stated Pain Goal: 0 (12/15/21 1730) ? ?Complications: There were no known notable events for this encounter. ?

## 2021-12-16 NOTE — Progress Notes (Signed)
? ? ? ? ? ?  Patient Name: Leslie Taylor ?  ? ?  ?SUBJECTIVE: Underwent catheter ablation for atrial fibrillation ?History reviewed; sleep disordered breathing-quite profound ?Some pleuritic chest discomfort ? ? ?Past Medical History:  ?Diagnosis Date  ? Anemia   ? Anxiety with possible prior panic attacks in the past 05/26/2013  ? Cancer [facility])   ? skin  ? Chest pain, atypical 05/26/2013  ? Choledocholithiasis 2010  ? ERCP Dr Benson [place] 04/2009  ? 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 Followed by [facility]   ? Dysphagia 05/26/2013  ? Hypertension   ? Nephrolithiasis 2003  ? removed w stent  ? PAF (paroxysmal atrial fibrillation) ([place])   ? 11/06/10 - NUC stress - low risk, no ischemia. ECHO 11/06/10 - normal EF, mild MR/TR  ? Paraesophageal hiatal hernia 2010  ? lap repair 2010 with biologic mesh  ? Resting tremor 05/26/2013  ? ? ?Scheduled Meds: ? ?Scheduled Meds: ? apixaban  5 mg Oral BID  ? metoprolol succinate  50 mg Oral QHS  ? ?Continuous Infusions: ?acetaminophen, menthol-cetylpyridinium, ondansetron (ZOFRAN) IV ? ? ? ?PHYSICAL EXAM ?Vitals:  ? 12/15/21 1757 12/15/21 2010 12/16/21 0400 12/16/21 0759  ?BP: 136/81 (!) 145/81 118/66 132/65  ?Pulse: 90  94 96  ?Resp: '13 18 18 17  '$ ?Temp: 98.1 ?F (36.7 ?C) 98.1 ?F (36.7 ?C) 98 ?F (36.7 ?C) 97.7 ?F (36.5 ?C)  ?TempSrc: Oral Oral Oral Oral  ?SpO2: 97% 97% 97% 97%  ?Weight: 96.3 kg     ?Height: '5\' 11"'$  (1.803 m)     ? ?Well developed and nourished in no acute distress ?HENT normal ?Neck supple with JVP-  flat   ?Clear ?Regular rate and rhythm, no murmurs or gallops ?Abd-soft with active BS ?No Clubbing cyanosis edema ?Skin-warm and dry ?A & Oriented  Grossly normal sensory and motor function ? ?  ? ?TELEMETRY: Reviewed personnally pt in sinus rhythm: ? ?ECG personally reviewed ? ? ?Intake/Output Summary (Last 24 hours) at 12/16/2021 1228 ?Last data filed at 12/15/2021 2154 ?Gross per 24 hour  ?Intake 480 ml  ?Output 900 ml  ?Net -420 ml  ? ? ? ? ?ASSESSMENT AND PLAN: ?Atrial fibrillation  status post ablation ? ?Sleep disordered breathing ? ?Epistaxis ? ?Lengthy review of issues related to her atrial fibrillation.  #1-with her sleep disordered breathing, will be important to pursue sleep testing not withstanding her reluctance to consider a mask.  The benefits as a relates to decreasing the risk of recurrence post ablation are notable. ?#2-epistaxis has been an issue.  Is also been a discussion regarding Watchman.  It may be appropriate to think about a monitoring strategy following ablation to see if she has recurrence of atrial arrhythmias prior to undertaking a Watchman ?She will review these with Dr. Daune Perch. ? ?Postprocedure care reviewed. ? ? ? ?Signed, ?Virl Axe MD ? ?12/16/2021 ? ?

## 2021-12-16 NOTE — Anesthesia Postprocedure Evaluation (Signed)
Anesthesia Post Note ? ?Patient: Leslie Taylor ? ?Procedure(s) Performed: ATRIAL FIBRILLATION ABLATION ? ?  ? ?Patient location during evaluation: PACU ?Anesthesia Type: General ?Level of consciousness: awake and alert ?Pain management: pain level controlled ?Vital Signs Assessment: post-procedure vital signs reviewed and stable ?Respiratory status: spontaneous breathing, nonlabored ventilation, respiratory function stable and patient connected to nasal cannula oxygen ?Cardiovascular status: blood pressure returned to baseline and stable ?Postop Assessment: no apparent nausea or vomiting ?Anesthetic complications: no ? ? ?There were no known notable events for this encounter. ? ?Last Vitals:  ?Vitals:  ? 12/16/21 0400 12/16/21 0759  ?BP: 118/66 132/65  ?Pulse: 94 96  ?Resp: 18 17  ?Temp: 36.7 ?C 36.5 ?C  ?SpO2: 97% 97%  ?  ?Last Pain:  ?Vitals:  ? 12/16/21 0759  ?TempSrc: Oral  ?PainSc:   ? ? ?  ?  ?  ?  ?  ?  ? ?Audry Pili ? ? ? ? ?

## 2021-12-16 NOTE — Discharge Summary (Addendum)
?Discharge Summary  ?  ?Patient ID: Leslie Taylor ?MRN: [id number]; DOB: [birth year] ? ?Admit date: 12/15/2021 ?Discharge date: 12/16/2021 ? ?PCP:  Shirline Frees, MD ?  ?[facility] Providers ?Cardiologist:  Candee Furbish, MD  ?Electrophysiologist:  Vickie Epley, MD  { ? ? ?Discharge Diagnoses  ?  ?Principal Problem: ?  Atrial fibrillation ([place]) ? ? ? ?Diagnostic Studies/Procedures  ?  ?Atrial Fibrillation Ablation: 12/15/21 ? ?CONCLUSIONS: ?1. Successful PVI ?2. Intracardiac echo reveals normal left atrial architecture, trivial pericardial effusion, normal left ventricular function ?3. No early apparent complications. ?4.  Colchicine 0.6 mg by mouth twice daily for 5 days ?5.  Protonix 40 mg by mouth once daily for 45 days ?_____________ ?  ?History of Present Illness   ?  ?Leslie Taylor is a 66 y.o. female with PMH of paroxsymal Afib, HTN, anemia who was referred to Dr. Quentin Ore for consideration of ablation. She is followed by Dr. Marlou Porch for general cardiology. Seen by Dr. Marlou Porch 05/22/2021 with complaints of more freq episodes of AF, some lasting about an hour. Previously tried on flecainide. She has also had issues with nose bleeds. Presented to the office with Dr. Quentin Ore on 4/14 to discuss watchman and ablation to manage her AF. She was deemed appropriate candidate to proceed with ablation.  ? ?Hospital Course  ?  ?Underwent successful PVI with no early complications noted by Dr. Quentin Ore. It was recommended she be treated with colchicine 0.6 mg by mouth twice daily for 5 days along with protonix 40 mg by mouth once daily for 45 days. Post procedure telemetry noted SR. Per Dr. Caryl Comes, consider outpatient sleep study.   ? ?Patient was seen by Dr. Caryl Comes and deemed stable for discharge home. Follow up in the Afib Clinic has been arranged. Medications sent to patient's pharmacy.  ? ?Did the patient have an acute coronary syndrome (MI, NSTEMI, STEMI, etc) this admission?:  No                                ?Did the patient have a percutaneous coronary intervention (stent / angioplasty)?:  No.   ? ? ?_____________ ? ?Discharge Vitals ?Blood pressure 132/65, pulse 96, temperature 97.7 ?F (36.5 ?C), temperature source Oral, resp. rate 17, height '5\' 11"'$  (1.803 m), weight 96.3 kg, SpO2 97 %.  ?Filed Weights  ? 12/15/21 1302 12/15/21 1757  ?Weight: 90.7 kg 96.3 kg  ? ? ?Labs & Radiologic Studies  ?  ?CBC ?No results for input(s): WBC, NEUTROABS, HGB, HCT, MCV, PLT in the last 72 hours. ?Basic Metabolic Panel ?No results for input(s): NA, K, CL, CO2, GLUCOSE, BUN, CREATININE, CALCIUM, MG, PHOS in the last 72 hours. ?Liver Function Tests ?No results for input(s): AST, ALT, ALKPHOS, BILITOT, PROT, ALBUMIN in the last 72 hours. ?No results for input(s): LIPASE, AMYLASE in the last 72 hours. ?High Sensitivity Troponin:   ?No results for input(s): TROPONINIHS in the last 720 hours.  ?BNP ?Invalid input(s): POCBNP ?D-Dimer ?No results for input(s): DDIMER in the last 72 hours. ?Hemoglobin A1C ?No results for input(s): HGBA1C in the last 72 hours. ?Fasting Lipid Panel ?No results for input(s): CHOL, HDL, LDLCALC, TRIG, CHOLHDL, LDLDIRECT in the last 72 hours. ?Thyroid Function Tests ?No results for input(s): TSH, T4TOTAL, T3FREE, THYROIDAB in the last 72 hours. ? ?Invalid input(s): FREET3 ?_____________  ?EP STUDY ? ?Result Date: 12/16/2021 ?CONCLUSIONS: 1. Successful PVI 2. Intracardiac echo reveals normal left atrial  architecture, trivial pericardial effusion, normal left ventricular function 3. No early apparent complications. 4.  Colchicine 0.6 mg by mouth twice daily for 5 days 5.  Protonix 40 mg by mouth once daily for 45 days  ? ?CT CARDIAC MORPH/PULM VEIN W/CM&W/O CA SCORE ? ?Addendum Date: 12/07/2021   ?ADDENDUM REPORT: 12/07/2021 12:00 CLINICAL DATA:  Watchman evaluation EXAM: Cardiac CT/CTA TECHNIQUE: The patient was scanned on a Siemens Somatom scanner. FINDINGS: A 120 kV prospective scan was triggered in the descending  thoracic aorta at 111 HU's. Gantry rotation speed was 280 msecs and collimation was .9 mm. No beta blockade and no NTG was given. The 3D data set was reconstructed in 5% intervals of the 25-50 % of the R-R cycle. Phases were analyzed on a dedicated work station using MPR, MIP and VRT modes. The patient received 80 cc of contrast. Left atrium: Mild enlargement. No thrombus in the left atrium or left atrial appendage. LAA is windsock morphology, with landing zone measures 10m x 262min diameter with area 518 mm^2 and perimeter 8367mDepth measures 25m96mft ventricle: Normal in size. Right atrium: Mild enlargement. Right ventricle:  Normal in size. Pericardium: Normal thickness Pulmonary veins: Left superior pulmonary vein: Measures 2.1cm x 1.7cm in diameter with area 2.9cm^2. Left inferior pulmonary vein: Measures 2.0cm x 1.5cm in diameter with area 2.4cm^2. Right superior pulmonary vein: Measures 2.6cm x 2.3cm in diameter with area 4.6cm^2. Right inferior pulmonary vein: Measures 1.9cm x 1.6cm in diameter with area 2.3cm^2. Pulmonary arteries: Normal size Coronary Arteries: Normal coronary origin. Right dominance. The study was performed without use of NTG and insufficient for plaque evaluation. Calcium score 138 (83rd percentile) Esophagus: Courses posterior to ostium of left lower pulmonary vein IMPRESSION: 1. Left atrial appendage is windsock morphology with landing zone measuring 30mm77m4mm 9miameter with depth 25mm, 28mopriate for placement of 35mm Wa64man FLX device 2. There is normal pulmonary vein drainage into the left atrium. Measurements as reported 3.  There is no thrombus in the left atrial appendage. 4. Esophagus courses posterior to ostium of left lower pulmonary vein 5.  Coronary calcium score 138 (83rd percentile) Electronically Signed   By: ChristopOswaldo MilianOn: 12/07/2021 12:00  ? ?Result Date: 12/07/2021 ?EXAM: OVER-READ INTERPRETATION  CT CHEST The following report is an over-read  performed by radiologist Dr. GeoffreyZetta BillsnsboBerg[facility]gy, PA on 4/Maybee2023. This over-read does not include interpretation of cardiac or coronary anatomy or pathology. The coronary calcium score/coronary CTA interpretation by the cardiologist is attached. COMPARISON:  None FINDINGS: Vascular: See dedicated report for cardiovascular details. Mediastinum/Nodes: No adenopathy or acute mediastinal findings. Lungs/Pleura: Lungs are clear.  Visualized airways are patent. Upper Abdomen: Postoperative changes of hiatal hernia repair partially visualized. No acute upper abdominal findings to the extent evaluated. Musculoskeletal: No acute bone finding. No destructive bone process. Spinal degenerative changes. IMPRESSION: No acute or significant extracardiac findings. Electronically Signed: By: GeoffreyZetta Bills: 12/07/2021 10:45   ? ?Disposition  ? ?Pt is being discharged home today in good condition. ? ?Follow-up Plans & Appointments  ? ? ? Follow-up Information   ? ? Fenton, Clint R, PA Follow up on 01/12/2022.   ?Specialty: Cardiology ?Why: at 11am for your follow up appt ?Contact information: ?[phone]?  ? ? Lambert,Vickie Epleylow up on 03/19/2022.   ?Specialties: Cardiology, Radiology ?Why: at 10:45am for your follow up appt. ?  Contact information: ?[address] ?[place] [address] ?[phone] ? ? ?  ?  ? ?  ?  ? ?  ? ?Discharge Instructions   ? ? Diet - low sodium heart healthy   Complete by: As directed ?  ? Discharge instructions   Complete by: As directed ?  ? Groin Site Care ?Refer to this sheet in the next few weeks. These instructions provide you with information on caring for yourself after your procedure. Your caregiver may also give you more specific instructions. Your treatment has been planned according to current medical practices, but problems sometimes occur. Call your caregiver if you have any problems or questions after  your procedure. ?HOME CARE INSTRUCTIONS ?You may shower 24 hours after the procedure. Remove the bandage (dressing) and gently wash the site with plain soap and water. Gently pat the site dry.  ?Do not appl

## 2021-12-16 NOTE — Progress Notes (Signed)
Nursing dC note ? ? ?Patient alert and oriented verbalized understanding of dc instructions. Central tele notified of dc order. All belongings and dc papers given to patient ?

## 2021-12-18 ENCOUNTER — Telehealth: Payer: Self-pay | Admitting: Cardiology

## 2021-12-18 ENCOUNTER — Encounter: Payer: Self-pay | Admitting: Cardiology

## 2021-12-18 ENCOUNTER — Encounter (HOSPITAL_COMMUNITY): Payer: Self-pay | Admitting: Cardiology

## 2021-12-18 NOTE — Telephone Encounter (Signed)
Called to speak with patient regarding chest pain. ? ?Patient with pleuritic chest pain c/w pericarditis. ? ?She is taking colchicine twice daily. ?I have recommended she add ibuprofen '600mg'$  PO BID with food for the next 5 days. ? ?I am also going to have her seen in the AF Clinic in the next 2-3 days. ? ?Lysbeth Galas T. Quentin Ore, MD, [facility], [place] ?Cardiac Electrophysiology ? ?

## 2021-12-19 ENCOUNTER — Telehealth (HOSPITAL_COMMUNITY): Payer: Self-pay | Admitting: Physician Assistant

## 2021-12-19 NOTE — Telephone Encounter (Signed)
Called and left message for patient to call back.  Per Dr. Quentin Ore, pt needs appt with [facility] Clinic today 4/18 or tomorrow 4/19. ?

## 2021-12-20 ENCOUNTER — Ambulatory Visit (HOSPITAL_COMMUNITY)
Admission: RE | Admit: 2021-12-20 | Discharge: 2021-12-20 | Disposition: A | Discharge status: Home / Self Care | Payer: Medicare Other | Source: Ambulatory Visit | Attending: Physician Assistant | Admitting: Physician Assistant

## 2021-12-20 VITALS — BP 120/78 | HR 91 | Ht 71.0 in | Wt 211.8 lb

## 2021-12-20 DIAGNOSIS — Z7901 Long term (current) use of anticoagulants: Secondary | ICD-10-CM | POA: Insufficient documentation

## 2021-12-20 DIAGNOSIS — I309 Acute pericarditis, unspecified: Secondary | ICD-10-CM | POA: Diagnosis not present

## 2021-12-20 DIAGNOSIS — I4892 Unspecified atrial flutter: Secondary | ICD-10-CM | POA: Diagnosis not present

## 2021-12-20 DIAGNOSIS — I4819 Other persistent atrial fibrillation: Secondary | ICD-10-CM | POA: Diagnosis not present

## 2021-12-20 DIAGNOSIS — R9431 Abnormal electrocardiogram [ECG] [EKG]: Secondary | ICD-10-CM | POA: Insufficient documentation

## 2021-12-20 DIAGNOSIS — I10 Essential (primary) hypertension: Secondary | ICD-10-CM | POA: Diagnosis not present

## 2021-12-20 DIAGNOSIS — I319 Disease of pericardium, unspecified: Secondary | ICD-10-CM | POA: Diagnosis not present

## 2021-12-20 DIAGNOSIS — Z791 Long term (current) use of non-steroidal anti-inflammatories (NSAID): Secondary | ICD-10-CM | POA: Insufficient documentation

## 2021-12-20 DIAGNOSIS — Z79899 Other long term (current) drug therapy: Secondary | ICD-10-CM | POA: Diagnosis not present

## 2021-12-20 DIAGNOSIS — D6869 Other thrombophilia: Secondary | ICD-10-CM | POA: Insufficient documentation

## 2021-12-20 DIAGNOSIS — I480 Paroxysmal atrial fibrillation: Secondary | ICD-10-CM | POA: Insufficient documentation

## 2021-12-20 MED ORDER — COLCHICINE 0.6 MG PO TABS
0.6000 mg | ORAL_TABLET | Freq: Two times a day (BID) | ORAL | 0 refills | Status: DC
Start: 2021-12-20 — End: 2022-01-03

## 2021-12-20 NOTE — Progress Notes (Signed)
? ? ?Primary Care Physician: Shirline Frees, MD ?Primary Cardiologist: Dr Marlou Porch ?Primary Electrophysiologist: Dr Quentin Ore ?Referring Physician: Dr Quentin Ore ? ? ?Leslie Taylor is a 66 y.o. female with a history of HTN, atrial flutter, and atrial fibrillation who presents for follow up in the [facility]. Patient is on Eliquis for a CHADS2VASC score of 3. She underwent afib ablation with Dr Quentin Ore on 12/15/21. She called with pleuritic CP consistent with pericarditis on 12/18/21 and she was started on ibuprofen in addition to colchicine. She reports today that her symptoms have improved but not resolved. She still sleeps in recliner because laying flat is uncomfortable. She is not SOB. Her swallowing pain has resolved. No groin issues.  ? ?Today, she denies symptoms of palpitations, shortness of breath, orthopnea, PND, lower extremity edema, dizziness, presyncope, syncope, snoring, daytime somnolence, bleeding, or neurologic sequela. The patient is tolerating medications without difficulties and is otherwise without complaint today.  ? ? ?Atrial Fibrillation Risk Factors: ? ?she does not have symptoms or diagnosis of sleep apnea. ?she does not have a history of rheumatic fever. ? ? ?she has a BMI of Body mass index is 29.54 kg/m?Marland KitchenMarland Kitchen ?Filed Weights  ? 12/20/21 1430  ?Weight: 96.1 kg  ? ? ?Family History  ?Problem Relation Age of Onset  ? Cancer Father   ?     prostate  ? ? ? ?Atrial Fibrillation Management history: ? ?Previous antiarrhythmic drugs: none ?Previous cardioversions: none ?Previous ablations: 12/15/21 ?CHADS2VASC score: 3 ?Anticoagulation history: Eliquis ? ? ?Past Medical History:  ?Diagnosis Date  ? Anemia   ? Anxiety with possible prior panic attacks in the past 05/26/2013  ? [facility])   ? skin  ? Chest pain, atypical 05/26/2013  ? Choledocholithiasis 2010  ? ERCP Dr Benson [place] 04/2009  ? 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 Followed by [facility]   ? Dysphagia 05/26/2013  ? Hypertension    ? Nephrolithiasis 2003  ? removed w stent  ? PAF (paroxysmal atrial fibrillation) ([place])   ? 11/06/10 - NUC stress - low risk, no ischemia. ECHO 11/06/10 - normal EF, mild MR/TR  ? Paraesophageal hiatal hernia 2010  ? lap repair 2010 with biologic mesh  ? Resting tremor 05/26/2013  ? ?Past Surgical History:  ?Procedure Laterality Date  ? ATRIAL FIBRILLATION ABLATION N/A 12/15/2021  ? Procedure: ATRIAL FIBRILLATION ABLATION;  Surgeon: Vickie Epley, MD;  Location: [facility];  Service: Cardiovascular;  Laterality: N/A;  ? CYSTOSCOPY/RETROGRADE/URETEROSCOPY/STONE EXTRACTION WITH BASKET  2003  ? Cystoscopy and urethral dilation, right retrograde pyelogram with  ? LAPAROSCOPIC CHOLECYSTECTOMY W/ CHOLANGIOGRAPHY  2010  ? LAPAROSCOPIC NISSEN FUNDOPLICATION  7616  ? LAPAROSCOPIC PARAESOPHAGEAL HERNIA REPAIR  2010  ? primery with biologic mesh reinforcement  ? SQUAMOUS CELL CARCINOMA EXCISION  2005  ? ? ?Current Outpatient Medications  ?Medication Sig Dispense Refill  ? ALPRAZolam (XANAX) 0.5 MG tablet Take 0.5 mg by mouth at bedtime.    ? apixaban (ELIQUIS) 5 MG TABS tablet Take 1 tablet (5 mg total) by mouth 2 (two) times daily. 180 tablet 1  ? Calcium Carb-Cholecalciferol (CALCIUM 600 + D PO) Take 1 tablet by mouth daily.    ? colchicine 0.6 MG tablet Take 1 tablet (0.6 mg total) by mouth 2 (two) times daily for 5 days. 10 tablet 0  ? diltiazem (CARDIZEM) 60 MG tablet Take 1 tablet (60 mg total) by mouth 4 (four) times daily as needed. 30 tablet 3  ? ibuprofen (ADVIL) 600  MG tablet Take 600 mg by mouth 2 (two) times daily.    ? Magnesium 500 MG TABS Take 500 mg by mouth daily.    ? metoprolol succinate (TOPROL-XL) 50 MG 24 hr tablet Take 50 mg by mouth at bedtime.    ? Multiple Vitamin (MULTIVITAMIN) tablet Take 1 tablet by mouth daily.    ? pantoprazole (PROTONIX) 40 MG tablet Take 1 tablet (40 mg total) by mouth daily. 45 tablet 0  ? sodium chloride (OCEAN) 0.65 % SOLN nasal spray Place 1 spray into both  nostrils as needed for congestion.    ? traMADol (ULTRAM) 50 MG tablet Take 1-2 tablets (50-100 mg total) by mouth every 8 (eight) hours as needed. 60 tablet 0  ? ?No current facility-administered medications for this encounter.  ? ? ?Allergies  ?Allergen Reactions  ? Propranolol Hcl Other (See Comments)  ?  Fatigue   ? Shellfish-Derived Products Nausea And Vomiting  ?  Flu like symptoms   ? Wellbutrin [Bupropion]   ?  Insomnia   ? ? ?Social History  ? ?Socioeconomic History  ? Marital status: Married  ?  Spouse name: Not on file  ? Number of children: Not on file  ? Years of education: Not on file  ? Highest education level: Not on file  ?Occupational History  ? Not on file  ?Tobacco Use  ? Smoking status: Never  ? Smokeless tobacco: Never  ?Substance and Sexual Activity  ? Alcohol use: Yes  ?  Comment: occ  ? Drug use: No  ? Sexual activity: Not on file  ?Other Topics Concern  ? Not on file  ?Social History Narrative  ? Not on file  ? ?Social Determinants of Health  ? ?Financial Resource Strain: Not on file  ?Food Insecurity: Not on file  ?Transportation Needs: Not on file  ?Physical Activity: Not on file  ?Stress: Not on file  ?Social Connections: Not on file  ?Intimate Partner Violence: Not on file  ? ? ? ?ROS- All systems are reviewed and negative except as per the HPI above. ? ?Physical Exam: ?Vitals:  ? 12/20/21 1430  ?BP: 120/78  ?Pulse: 91  ?Weight: 96.1 kg  ?Height: '5\' 11"'$  (1.803 m)  ? ? ?GEN- The patient is a well appearing female, alert and oriented x 3 today.   ?Head- normocephalic, atraumatic ?Eyes-  Sclera clear, conjunctiva pink ?Ears- hearing intact ?Oropharynx- clear ?Neck- supple  ?Lungs- Clear to ausculation bilaterally, normal work of breathing ?Heart- Regular rate and rhythm, no murmurs, rubs or gallops  ?GI- soft, NT, ND, + BS ?Extremities- no clubbing, cyanosis, or edema ?MS- no significant deformity or atrophy ?Skin- no rash or lesion ?Psych- euthymic mood, full affect ?Neuro- strength and  sensation are intact ? ?Wt Readings from Last 3 Encounters:  ?12/20/21 96.1 kg  ?12/15/21 96.3 kg  ?07/06/21 92.5 kg  ? ? ?EKG today demonstrates  ?SR ?Vent. rate 91 BPM ?PR interval 148 ms ?QRS duration 86 ms ?QT/QTcB 346/425 ms ? ?Echo 11/10/21 demonstrated  ? 1. Left ventricular ejection fraction by 3D volume is 67 %. The left  ?ventricle has normal function. The left ventricle has no regional wall  ?motion abnormalities. There is mild asymmetric left ventricular  ?hypertrophy of the septal segment. Left ventricular diastolic parameters were normal. The average left ventricular global longitudinal strain is -19.0 %. The global longitudinal strain is normal.  ? 2. Right ventricular systolic function is normal. The right ventricular  ?size is normal.  ?  3. The mitral valve is normal in structure. No evidence of mitral valve  ?regurgitation. No evidence of mitral stenosis.  ? 4. The aortic valve is normal in structure. Aortic valve regurgitation is  ?not visualized. No aortic stenosis is present.  ? 5. The inferior vena cava is normal in size with greater than 50%  ?respiratory variability, suggesting right atrial pressure of 3 mmHg.  ? ?Epic records are reviewed at length today ? ?CHA2DS2-VASc Score = 3  ?The patient's score is based upon: ?CHF History: 0 ?HTN History: 0 ?Diabetes History: 0 ?Stroke History: 0 ?Vascular Disease History: 1 ?Age Score: 1 ?Gender Score: 1 ?    ? ? ?ASSESSMENT AND PLAN: ?1. Paroxysmal Atrial Fibrillation (ICD10:  I48.0) ?The patient's CHA2DS2-VASc score is 3, indicating a 3.2% annual risk of stroke.   ?S/p afib ablation 12/15/21 ?Patient appears to be maintaining SR. ?Continue Eliquis 5 mg BID with no missed doses for 3 months post ablation.  ?Continue Toprol 50 mg daily ?Continue diltiazem 60 mg q 6 hours PRN for heart racing.  ? ?2. Secondary Hypercoagulable State (ICD10:  D68.69) ?The patient is at significant risk for stroke/thromboembolism based upon her CHA2DS2-VASc Score of 3.   Continue Apixaban (Eliquis).  ? ?3. HTN ?Stable, no changes today. ? ?4. Pericarditis  ?Post ablation ?Patient slowly improving, patient reassured.  ?D/w Dr Quentin Ore. Will increase ibuprofen to 600 mg TID for

## 2021-12-20 NOTE — Patient Instructions (Signed)
Increase ibuprofen to '600mg'$  three times a day for the next 3 days then stop ? ?Continue colchicine for an additional 10 days  ?

## 2022-01-03 ENCOUNTER — Ambulatory Visit (HOSPITAL_COMMUNITY)
Admission: RE | Admit: 2022-01-03 | Discharge: 2022-01-03 | Disposition: A | Discharge status: Home / Self Care | Payer: Medicare Other | Source: Ambulatory Visit | Attending: Physician Assistant | Admitting: Physician Assistant

## 2022-01-03 VITALS — BP 150/90 | HR 91 | Ht 71.0 in | Wt 208.0 lb

## 2022-01-03 DIAGNOSIS — I10 Essential (primary) hypertension: Secondary | ICD-10-CM | POA: Diagnosis not present

## 2022-01-03 DIAGNOSIS — M542 Cervicalgia: Secondary | ICD-10-CM | POA: Insufficient documentation

## 2022-01-03 DIAGNOSIS — D6869 Other thrombophilia: Secondary | ICD-10-CM | POA: Diagnosis not present

## 2022-01-03 DIAGNOSIS — Z79899 Other long term (current) drug therapy: Secondary | ICD-10-CM | POA: Insufficient documentation

## 2022-01-03 DIAGNOSIS — M25512 Pain in left shoulder: Secondary | ICD-10-CM | POA: Diagnosis not present

## 2022-01-03 DIAGNOSIS — Z7901 Long term (current) use of anticoagulants: Secondary | ICD-10-CM | POA: Insufficient documentation

## 2022-01-03 DIAGNOSIS — I480 Paroxysmal atrial fibrillation: Secondary | ICD-10-CM | POA: Diagnosis not present

## 2022-01-03 NOTE — Progress Notes (Signed)
? ? ?Primary Care Physician: Shirline Frees, MD ?Primary Cardiologist: Dr Marlou Porch ?Primary Electrophysiologist: Dr Quentin Ore ?Referring Physician: Dr Quentin Ore ? ? ?Leslie Taylor is a 66 y.o. female with a history of HTN, atrial flutter, and atrial fibrillation who presents for follow up in the [facility]. Patient is on Eliquis for a CHADS2VASC score of 3. She underwent afib ablation with Dr Quentin Ore on 12/15/21. She called with pleuritic CP consistent with pericarditis on 12/18/21 and she was started on ibuprofen in addition to colchicine.  ? ?On follow up today, patient reports that her chest pain has resolved with the ibuprofen and colchicine. She states she is "feeling better now than I have in months." She has had mild headaches as well has left shoulder and back pain in the last two days. She also had some bilateral lower extremity edema which is resolving.  ? ?Today, she denies symptoms of palpitations, shortness of breath, orthopnea, PND, dizziness, presyncope, syncope, snoring, daytime somnolence, bleeding, or neurologic sequela. The patient is tolerating medications without difficulties and is otherwise without complaint today.  ? ? ?Atrial Fibrillation Risk Factors: ? ?she does not have symptoms or diagnosis of sleep apnea. ?she does not have a history of rheumatic fever. ? ? ?she has a BMI of Body mass index is 29.01 kg/m?Marland KitchenMarland Kitchen ?Filed Weights  ? 01/03/22 1435  ?Weight: 94.3 kg  ? ? ? ?Family History  ?Problem Relation Age of Onset  ? Cancer Father   ?     prostate  ? ? ? ?Atrial Fibrillation Management history: ? ?Previous antiarrhythmic drugs: none ?Previous cardioversions: none ?Previous ablations: 12/15/21 ?CHADS2VASC score: 3 ?Anticoagulation history: Eliquis ? ? ?Past Medical History:  ?Diagnosis Date  ? Anemia   ? Anxiety with possible prior panic attacks in the past 05/26/2013  ? Cancer [facility])   ? skin  ? Chest pain, atypical 05/26/2013  ? Choledocholithiasis 2010  ? ERCP Dr Benson Norway  04/2009  ? 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 [facility] by [facility]   ? Dysphagia 05/26/2013  ? Hypertension   ? Nephrolithiasis 2003  ? removed w stent  ? PAF (paroxysmal atrial fibrillation) ([place])   ? 11/06/10 - NUC stress - low risk, no ischemia. ECHO 11/06/10 - normal EF, mild MR/TR  ? Paraesophageal hiatal hernia 2010  ? lap repair 2010 with biologic mesh  ? Resting tremor 05/26/2013  ? ?Past Surgical History:  ?Procedure Laterality Date  ? ATRIAL FIBRILLATION ABLATION N/A 12/15/2021  ? Procedure: ATRIAL FIBRILLATION ABLATION;  Surgeon: Vickie Epley, MD;  Location: [place] [facility];  Service: Cardiovascular;  Laterality: N/A;  ? CYSTOSCOPY/RETROGRADE/URETEROSCOPY/STONE EXTRACTION WITH BASKET  2003  ? Cystoscopy and urethral dilation, right retrograde pyelogram with  ? LAPAROSCOPIC CHOLECYSTECTOMY W/ CHOLANGIOGRAPHY  2010  ? LAPAROSCOPIC NISSEN FUNDOPLICATION  7425  ? LAPAROSCOPIC PARAESOPHAGEAL HERNIA REPAIR  2010  ? primery with biologic mesh reinforcement  ? SQUAMOUS CELL CARCINOMA EXCISION  2005  ? ? ?Current Outpatient Medications  ?Medication Sig Dispense Refill  ? ALPRAZolam (XANAX) 0.5 MG tablet Take 0.5 mg by mouth at bedtime.    ? apixaban (ELIQUIS) 5 MG TABS tablet Take 1 tablet (5 mg total) by mouth 2 (two) times daily. 180 tablet 1  ? Calcium Carb-Cholecalciferol (CALCIUM 600 + D PO) Take 1 tablet by mouth daily.    ? diltiazem (CARDIZEM) 60 MG tablet Take 1 tablet (60 mg total) by mouth 4 (four) times daily as needed. 30 tablet 3  ? Magnesium 500  MG TABS Take 500 mg by mouth daily.    ? metoprolol succinate (TOPROL-XL) 50 MG 24 hr tablet Take 50 mg by mouth at bedtime.    ? Multiple Vitamin (MULTIVITAMIN) tablet Take 1 tablet by mouth daily.    ? pantoprazole (PROTONIX) 40 MG tablet Take 1 tablet (40 mg total) by mouth daily. 45 tablet 0  ? sodium chloride (OCEAN) 0.65 % SOLN nasal spray Place 1 spray into both nostrils as needed for congestion.    ? traMADol (ULTRAM) 50 MG tablet Take 1-2 tablets  (50-100 mg total) by mouth every 8 (eight) hours as needed. 60 tablet 0  ? ?No current facility-administered medications for this encounter.  ? ? ?Allergies  ?Allergen Reactions  ? Propranolol Hcl Other (See Comments)  ?  Fatigue   ? Shellfish-Derived Products Nausea And Vomiting  ?  Flu like symptoms   ? Wellbutrin [Bupropion]   ?  Insomnia   ? ? ?Social History  ? ?Socioeconomic History  ? Marital status: Married  ?  Spouse name: Not on file  ? Number of children: Not on file  ? Years of education: Not on file  ? Highest education level: Not on file  ?Occupational History  ? Not on file  ?Tobacco Use  ? Smoking status: Never  ? Smokeless tobacco: Never  ?Substance and Sexual Activity  ? Alcohol use: Yes  ?  Comment: occ  ? Drug use: No  ? Sexual activity: Not on file  ?Other Topics Concern  ? Not on file  ?Social History Narrative  ? Not on file  ? ?Social Determinants of Health  ? ?Financial Resource Strain: Not on file  ?Food Insecurity: Not on file  ?Transportation Needs: Not on file  ?Physical Activity: Not on file  ?Stress: Not on file  ?Social Connections: Not on file  ?Intimate Partner Violence: Not on file  ? ? ? ?ROS- All systems are reviewed and negative except as per the HPI above. ? ?Physical Exam: ?Vitals:  ? 01/03/22 1435  ?BP: (!) 150/90  ?Pulse: 91  ?Weight: 94.3 kg  ?Height: '5\' 11"'$  (1.803 m)  ? ? ?GEN- The patient is a well appearing female, alert and oriented x 3 today.   ?HEENT-head normocephalic, atraumatic, sclera clear, conjunctiva pink, hearing intact, trachea midline. ?Lungs- Clear to ausculation bilaterally, normal work of breathing ?Heart- Regular rate and rhythm, no murmurs, rubs or gallops  ?GI- soft, NT, ND, + BS ?Extremities- no clubbing, cyanosis, or edema ?MS- no significant deformity or atrophy ?Skin- no rash or lesion ?Psych- euthymic mood, full affect ?Neuro- strength and sensation are intact ? ? ?Wt Readings from Last 3 Encounters:  ?01/03/22 94.3 kg  ?12/20/21 96.1 kg   ?12/15/21 96.3 kg  ? ? ?EKG today demonstrates  ?SR ?Vent. rate 91 BPM ?PR interval 140 ms ?QRS duration 84 ms ?QT/QTcB 356/437 ms ? ?Echo 11/10/21 demonstrated  ? 1. Left ventricular ejection fraction by 3D volume is 67 %. The left  ?ventricle has normal function. The left ventricle has no regional wall  ?motion abnormalities. There is mild asymmetric left ventricular  ?hypertrophy of the septal segment. Left ventricular diastolic parameters were normal. The average left ventricular global longitudinal strain is -19.0 %. The global longitudinal strain is normal.  ? 2. Right ventricular systolic function is normal. The right ventricular  ?size is normal.  ? 3. The mitral valve is normal in structure. No evidence of mitral valve  ?regurgitation. No evidence of mitral stenosis.  ?  4. The aortic valve is normal in structure. Aortic valve regurgitation is  ?not visualized. No aortic stenosis is present.  ? 5. The inferior vena cava is normal in size with greater than 50%  ?respiratory variability, suggesting right atrial pressure of 3 mmHg.  ? ?Epic records are reviewed at length today ? ?CHA2DS2-VASc Score = 3  ?The patient's score is based upon: ?CHF History: 0 ?HTN History: 0 ?Diabetes History: 0 ?Stroke History: 0 ?Vascular Disease History: 1 ?Age Score: 1 ?Gender Score: 1 ?    ? ? ?ASSESSMENT AND PLAN: ?1. Paroxysmal Atrial Fibrillation (ICD10:  I48.0) ?The patient's CHA2DS2-VASc score is 3, indicating a 3.2% annual risk of stroke.   ?S/p afib ablation 12/15/21 ?Continue Eliquis 5 mg BID with no missed doses for 3 months post ablation.  ?Continue Toprol 50 mg daily ?Continue diltiazem 60 mg q 6 hours PRN for heart racing.  ? ?2. Secondary Hypercoagulable State (ICD10:  D68.69) ?The patient is at significant risk for stroke/thromboembolism based upon her CHA2DS2-VASc Score of 3.  Continue Apixaban (Eliquis).  ? ?3. HTN ?Mildly elevated today, has been well controlled at previous visits. ?No change today.  ? ?4.  Pericarditis  ?Post ablation ?Resolved. ? ?5. Neck/shoulder pain ?Appears more musculoskeletal in nature ?Patient to monitor and reach out to PCP if symptoms do not improve with conservative measures.  ? ? ?Follow

## 2022-01-12 ENCOUNTER — Ambulatory Visit (HOSPITAL_COMMUNITY): Payer: Medicare Other | Admitting: Physician Assistant

## 2022-01-12 ENCOUNTER — Telehealth: Payer: Self-pay | Admitting: Cardiology

## 2022-01-12 DIAGNOSIS — E785 Hyperlipidemia, unspecified: Secondary | ICD-10-CM

## 2022-01-12 MED ORDER — ROSUVASTATIN CALCIUM 10 MG PO TABS
10.0000 mg | ORAL_TABLET | Freq: Every day | ORAL | 11 refills | Status: DC
Start: 2022-01-12 — End: 2023-01-18

## 2022-01-12 NOTE — Telephone Encounter (Signed)
Pt returning nurse's call. Please advise

## 2022-01-12 NOTE — Telephone Encounter (Signed)
Since there was elevated coronary artery calcification, with score of 138 which is 83rd percentile, I would like for her to start Crestor 10 mg once a day and check a lipid panel in 2 months.  LDL goal less than 70.  Continue with diet, exercise.  ?Candee Furbish, MD  ? ?Called patient with results above. Order for lab and medication place. Patient will come in for lab work on 03/14/22. ?

## 2022-01-26 ENCOUNTER — Other Ambulatory Visit: Payer: Self-pay | Admitting: Cardiology

## 2022-01-26 NOTE — Telephone Encounter (Signed)
Pt received protonix for post operative prophylaxis after ablation.   It was not our intention for her to continue chronically.   If needed for other reasons, will need follow up with PCP.   Legrand Como [address]" [place], PA-C  01/26/2022 8:24 AM

## 2022-03-12 ENCOUNTER — Telehealth: Payer: Self-pay

## 2022-03-12 NOTE — Telephone Encounter (Signed)
Per [employer] review of 12/07/2021 CT:

## 2022-03-14 ENCOUNTER — Other Ambulatory Visit: Payer: Medicare Other

## 2022-03-14 DIAGNOSIS — E785 Hyperlipidemia, unspecified: Secondary | ICD-10-CM | POA: Diagnosis not present

## 2022-03-14 LAB — LIPID PANEL
Chol/HDL Ratio: 2.7 ratio (ref 0.0–4.4)
Cholesterol, Total: 148 mg/dL (ref 100–199)
HDL: 54 mg/dL (ref >39)
LDL Chol Calc (NIH): 72 mg/dL (ref 0–99)
Triglycerides: 127 mg/dL (ref 0–149)
VLDL Cholesterol Cal: 22 mg/dL (ref 5–40)

## 2022-03-19 ENCOUNTER — Encounter: Payer: Self-pay | Admitting: Cardiology

## 2022-03-19 ENCOUNTER — Ambulatory Visit: Payer: Medicare Other | Admitting: Cardiology

## 2022-03-19 VITALS — BP 122/80 | HR 94 | Ht 71.0 in | Wt 195.2 lb

## 2022-03-19 DIAGNOSIS — I480 Paroxysmal atrial fibrillation: Secondary | ICD-10-CM

## 2022-03-19 NOTE — Patient Instructions (Signed)
Medication Instructions:  Your physician recommends that you continue on your current medications as directed. Please refer to the Current Medication list given to you today. *If you need a refill on your cardiac medications before your next appointment, please call your pharmacy*  Lab Work: None. If you have labs (blood work) drawn today and your tests are completely normal, you will receive your results only by: [place] (if you have MyChart) OR A paper copy in the mail If you have any lab test that is abnormal or we need to change your treatment, we will call you to review the results.  Testing/Procedures: None.  Follow-Up: At [facility], you and your health needs are our priority.  As part of our continuing mission to provide you with exceptional heart care, we have created designated Provider Care Teams.  These Care Teams include your primary Cardiologist (physician) and Advanced Practice Providers (APPs -  Physician Assistants and Nurse Practitioners) who all work together to provide you with the care you need, when you need it.  Your physician wants you to follow-up in: Lenice Llamas, the [facility] Nurse Navigator will call you for an update on proceedings. [place]'s direct number is [phone] if you need assistance.   We recommend signing up for the patient portal called "MyChart".  Sign up information is provided on this After Visit Summary.  MyChart is used to connect with patients for Virtual Visits (Telemedicine).  Patients are able to view lab/test results, encounter notes, upcoming appointments, etc.  Non-urgent messages can be sent to your provider as well.   To learn more about what you can do with MyChart, go to [email or web address].    Any Other Special Instructions Will Be Listed Below (If Applicable).

## 2022-03-19 NOTE — Progress Notes (Signed)
Electrophysiology Office Follow up Visit Note:    Date:  03/19/2022   ID:  Leslie Taylor, DOB September 27, 1955, MRN [id number]  PCP:  Shirline Frees, MD  [facility] Cardiologist:  Candee Furbish, MD  [facility] Electrophysiologist:  Vickie Epley, MD    Interval History:    Leslie Taylor is a 66 y.o. female who presents for a follow up visit after an A-fib ablation on December 15, 2021.  During the ablation the veins were isolated.  She had some pericarditis after the ablation but this is since resolved.  On April 3 she saw Audry Pili in the A-fib clinic and was doing well.  We had previously discussed left atrial appendage occlusion as a mechanism to avoid long-term exposure anticoagulation given her history of recurrent epistaxis.  Today she tells me she is doing very well.  She has a loose tooth that she is planning to get pulled by dentist in the next couple of weeks.  No problems with her heart rhythm since her ablation.  She tells me that she has intermittent left neck and shoulder pain that is worse with looking the left.  She has an appoint with her primary care physician to address this.     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 ECHO 11/06/10 - normal EF, mild MR/TR   Paraesophageal hiatal hernia 2010   lap repair 2010 with biologic mesh   Resting tremor 05/26/2013    Past Surgical History:  Procedure Laterality Date   ATRIAL FIBRILLATION ABLATION N/A 12/15/2021   Procedure: ATRIAL FIBRILLATION ABLATION;  Surgeon: Vickie Epley, MD;  Location: [place]  [facility];  Service: Cardiovascular;  Laterality: N/A;   CYSTOSCOPY/RETROGRADE/URETEROSCOPY/STONE EXTRACTION WITH BASKET  2003   Cystoscopy and urethral dilation, right retrograde pyelogram with   LAPAROSCOPIC CHOLECYSTECTOMY W/ CHOLANGIOGRAPHY  2010   LAPAROSCOPIC NISSEN FUNDOPLICATION  1191   LAPAROSCOPIC PARAESOPHAGEAL HERNIA REPAIR  2010   primery with biologic mesh reinforcement   SQUAMOUS CELL CARCINOMA EXCISION  2005    Current Medications: Current Meds  Medication Sig   ALPRAZolam (XANAX) 0.5 MG tablet Take 0.5 mg by mouth at bedtime.   apixaban (ELIQUIS) 5 MG TABS tablet Take 1 tablet (5 mg total) by mouth 2 (two) times daily.   Calcium Carb-Cholecalciferol (CALCIUM 600 + D PO) Take 1 tablet by mouth daily.   diltiazem (CARDIZEM) 60 MG tablet Take 1 tablet (60 mg total) by mouth 4 (four) times daily as needed.   Magnesium 500 MG TABS Take 500 mg by mouth daily.   metoprolol succinate (TOPROL-XL) 50 MG 24 hr tablet Take 50 mg by mouth at bedtime.   Multiple Vitamin (MULTIVITAMIN) tablet Take 1 tablet by mouth daily.   rosuvastatin (CRESTOR) 10 MG tablet Take 1 tablet (10 mg total) by mouth daily.   sodium chloride (OCEAN) 0.65 % SOLN nasal spray Place 1 spray into both nostrils as needed for congestion.   traMADol (ULTRAM) 50 MG tablet Take 1-2 tablets (50-100  mg total) by mouth every 8 (eight) hours as needed.     Allergies:   Propranolol hcl, Shellfish-derived products, and Wellbutrin [bupropion]   Social History   Socioeconomic History   Marital status: Married    Spouse name: Not on file   Number of children: Not on file   Years of education: Not on file   Highest education level: Not on file  Occupational History   Not on file  Tobacco Use   Smoking status: Never   Smokeless tobacco: Never  Substance and Sexual Activity   Alcohol use: Yes    Comment: occ   Drug use: No   Sexual activity: Not on file  Other Topics Concern   Not on file  Social History Narrative    Not on file   Social Determinants of Health   Financial Resource Strain: Not on file  Food Insecurity: Not on file  Transportation Needs: Not on file  Physical Activity: Not on file  Stress: Not on file  Social Connections: Not on file     Family History: The patient's family history includes Cancer in her father.  ROS:   Please see the history of present illness.    All other systems reviewed and are negative.  EKGs/Labs/Other Studies Reviewed:    The following studies were reviewed today:    EKG:  The ekg ordered today demonstrates sinus rhythm.  Ventricular rate 94 bpm.  Recent Labs: 12/01/2021: BUN 11; Creatinine, Ser 0.86; Hemoglobin 12.1; Platelets 245; Potassium 4.0; Sodium 142  Recent Lipid Panel    Component Value Date/Time   CHOL 148 03/14/2022 1151   TRIG 127 03/14/2022 1151   HDL 54 03/14/2022 1151   CHOLHDL 2.7 03/14/2022 1151   CHOLHDL 3.7 03/14/2009 0515   VLDL 18 03/14/2009 0515   LDLCALC 72 03/14/2022 1151    Physical Exam:    VS:  BP 122/80   Pulse 94   Ht '5\' 11"'$  (1.803 m)   Wt 195 lb 3.2 oz (88.5 kg)   SpO2 94%   BMI 27.22 kg/m     Wt Readings from Last 3 Encounters:  03/19/22 195 lb 3.2 oz (88.5 kg)  01/03/22 208 lb (94.3 kg)  12/20/21 211 lb 12.8 oz (96.1 kg)     GEN:  Well nourished, well developed in no acute distress HEENT: Normal NECK: No JVD; No carotid bruits LYMPHATICS: No lymphadenopathy CARDIAC: RRR, no murmurs, rubs, gallops RESPIRATORY:  Clear to auscultation without rales, wheezing or rhonchi  ABDOMEN: Soft, non-tender, non-distended MUSCULOSKELETAL:  No edema; No deformity  SKIN: Warm and dry NEUROLOGIC:  Alert and oriented x 3 PSYCHIATRIC:  Normal affect        ASSESSMENT:    1. PAF (paroxysmal atrial fibrillation) (HCC)    PLAN:    In order of problems listed above:  #Paroxysmal AF Doing well after her ablation procedure.  Currently on Eliquis but desires a long-term stroke risk mitigation strategy  avoiding blood thinners given her history of recurrent epistaxis.    I have seen Leslie Taylor in the office today who is being considered for a Watchman left atrial appendage closure device. I believe they will benefit from this procedure given their history of atrial fibrillation, CHA2DS2-VASc score of 3 and unadjusted ischemic stroke rate of 3.2% per year. Unfortunately, the patient is not felt to be a long term anticoagulation candidate secondary to recurrent epistaxis. The patient's chart has been reviewed and I feel that they would be a candidate  for short term oral anticoagulation after Watchman implant.   It is my belief that after undergoing a LAA closure procedure, MIMA CRANMORE will not need long term anticoagulation which eliminates anticoagulation side effects and major bleeding risk.   Procedural risks for the Watchman implant have been reviewed with the patient including a 0.5% risk of stroke, <1% risk of perforation and <1% risk of device embolization. Other risks include bleeding, vascular damage, tamponade, worsening renal function, and death. The patient understands these risk and wishes to proceed.     The published clinical data on the safety and effectiveness of WATCHMAN include but are not limited to the following: - Holmes DR, Mechele Claude, Sick P et al. for the PROTECT AF Investigators. Percutaneous closure of the left atrial appendage versus warfarin therapy for prevention of stroke in patients with atrial fibrillation: a randomised non-inferiority trial. Lancet 2009; 374: 534-42. Mechele Claude, Doshi SK, Abelardo Diesel D et al. on behalf of the PROTECT AF Investigators. Percutaneous Left Atrial Appendage Closure for Stroke Prophylaxis in Patients With Atrial Fibrillation 2.3-Year Follow-up of the PROTECT AF (Watchman Left Atrial Appendage System for Embolic Protection in Patients With Atrial Fibrillation) Trial. Circulation 2013; 127:720-729. - Alli O, Doshi S,  Kar S, Reddy  VY, Sievert H et al. Quality of Life Assessment in the Randomized PROTECT AF (Percutaneous Closure of the Left Atrial Appendage Versus Warfarin Therapy for Prevention of Stroke in Patients With Atrial Fibrillation) Trial of Patients at Risk for Stroke With Nonvalvular Atrial Fibrillation. J Am Coll Cardiol 2013; 50:9326-7. Vertell Limber DR, Tarri Abernethy, Price M, Kirby, Sievert H, Doshi S, Huber K, Reddy V. Prospective randomized evaluation of the Watchman left atrial appendage Device in patients with atrial fibrillation versus long-term warfarin therapy; the PREVAIL trial. Journal of the [employer] of Cardiology, Vol. 4, No. 1, 2014, 1-11. - Kar S, Doshi SK, Sadhu A, Horton R, Osorio J et al. Primary outcome evaluation of a next-generation left atrial appendage closure device: results from the PINNACLE FLX trial. Circulation 2021;143(18)1754-1762.    After today's visit with the patient which was dedicated solely for shared decision making visit regarding LAA closure device, the patient decided to proceed with the LAA appendage closure procedure scheduled to be done in the near future at [facility].   HAS-BLED score 3 Hypertension Yes  Abnormal renal and liver function (Dialysis, transplant, Cr >2.26 mg/dL /Cirrhosis or Bilirubin >2x Normal or AST/ALT/AP >3x Normal) No  Stroke No  Bleeding Yes  Labile INR (Unstable/high INR) No  Elderly (>65) Yes  Drugs or alcohol (? 8 drinks/week, anti-plt or NSAID) No   CHA2DS2-VASc Score = 3  The patient's score is based upon: CHF History: 0 HTN History: 1 Diabetes History: 0 Stroke History: 0 Vascular Disease History: 0 Age Score: 1 Gender Score: 1     Total time spent with patient today 45 minutes. This includes reviewing records, evaluating the patient and coordinating care.   Medication Adjustments/Labs and Tests Ordered: Current medicines are reviewed at length with the patient today.  Concerns regarding medicines are outlined  above.  No orders of the defined types were placed in this encounter.  No orders of the defined types were placed in this encounter.    Signed, Lars Mage, MD, [facility], [facility] 03/19/2022 11:03 AM    Electrophysiology [facility]

## 2022-03-21 ENCOUNTER — Telehealth: Payer: Self-pay

## 2022-03-21 NOTE — Telephone Encounter (Signed)
The patient reports she has a loose tooth that needs to be pulled, but she does not have a dentist. She has several calls out to dental offices and is awaiting the soonest appointment.  She understands she will need to be on Eliquis for 1 month prior to [place] (which she wants to have on 8/31). She understands if she only needs 1 tooth pulled, her Eliquis will likely not need to be held (but that is up to discretion of dentist).   She will call when she has a dental appointment scheduled. She understands if more teeth need to be pulled or if the dental appointment is too close to 8/31, she will have [place] at a later date.  She was grateful for call and agrees with plan.

## 2022-04-02 NOTE — Telephone Encounter (Signed)
The patient states she has not yet found a dentist. She promises to get an appointment this week and will call back by Friday with an update.  She was grateful for follow-up.

## 2022-04-06 NOTE — Telephone Encounter (Signed)
The patient reports she has not yet found a dentist. She is going on vacation next week and will not likely get an appointment while there. She requests to postpone LAAO until October. She understands a spot on 10/19 will be held for her. She will call when dental visit is confirmed. She was grateful for follow-up.

## 2022-04-23 NOTE — Telephone Encounter (Signed)
Per patient request, called her today to follow-up. She stated she will call in the AM.

## 2022-05-01 NOTE — Telephone Encounter (Signed)
The patient reports she has a dental appointment tomorrow.  She will call afterward to finalize Watchman plans. She was grateful for assistance.

## 2022-05-17 NOTE — Telephone Encounter (Signed)
Called the patient since she has not contacted the office. Left message to call back to give an update with dental proceedings.

## 2022-05-17 NOTE — Telephone Encounter (Signed)
Leslie Taylor reports she had special xrays done and has an infection in her mouth.  At this time, she states she does not have a dental plan yet. It was agreed that she will NOT plan for LAAO on 10/19 and she will call when she is ready to proceed with Watchman. She was grateful for assistance.

## 2022-06-04 DIAGNOSIS — Z1231 Encounter for screening mammogram for malignant neoplasm of breast: Secondary | ICD-10-CM | POA: Diagnosis not present

## 2022-06-07 ENCOUNTER — Telehealth: Payer: Self-pay | Admitting: *Deleted

## 2022-06-07 NOTE — Telephone Encounter (Signed)
   Pre-operative Risk Assessment    Patient Name: Leslie Taylor  DOB: May 01, 1956 MRN: [id number]     Request for Surgical Clearance    Procedure:  Dental Extraction - Amount of Teeth to be Pulled:  5 TEETH TO BE EXTRACTED  Date of Surgery:  Clearance TBD                                 Surgeon:  DR. Lavonne Chick Surgeon's Group or Practice Name:  [facility] Phone number:  [phone] Fax number:  [phone]   Type of Clearance Requested:   - Medical  - Pharmacy:  Hold Apixaban (Eliquis)     Type of Anesthesia:  Local    Additional requests/questions:    Jiles Prows   06/07/2022, 5:11 PM

## 2022-06-11 NOTE — Telephone Encounter (Signed)
Primary Cardiologist:Mark Marlou Porch, MD   Preoperative team, please contact this patient and set up a phone call appointment for further preoperative risk assessment. Please obtain consent and complete medication review. Thank you for your help.   I confirm that guidance regarding antiplatelet and oral anticoagulation therapy has been completed and, if necessary, noted below.   Emmaline Life, NP-C  06/11/2022, 5:00 PM [address] Office [phone] Fax [phone]

## 2022-06-11 NOTE — Telephone Encounter (Signed)
Patient with diagnosis of Afib on Eliquis for anticoagulation.    Procedure: Tooth extractions (5 teeth) Date of procedure: TBD   CHA2DS2-VASc Score = 4  This indicates a 4.8% annual risk of stroke. The patient's score is based upon: CHF History: 0 HTN History: 1 Diabetes History: 0 Stroke History: 0 Vascular Disease History: 1 Age Score: 1 Gender Score: 1   CrCl 78 mL/min using AdjBW Platelet count 245K  It has been >90 days since ablation.  Patient does not require pre-op antibiotics for dental procedure, since watchman procedure has been delayed. Per office protocol, patient can hold Eliquis for 1 day prior to procedure.    **This guidance is not considered finalized until pre-operative APP has relayed final recommendations.**

## 2022-06-12 ENCOUNTER — Telehealth: Payer: Self-pay | Admitting: *Deleted

## 2022-06-12 NOTE — Telephone Encounter (Signed)
Add on 06/14/22 @ 10:40 due to pt has infection and needs dental procedure . As well as pt states she cannot proceed with her cardiac procedure  (Watchman, see 03/21/22 phone note from Lenice Llamas, RN), until her dental procedure has been done.   Med rec and consent are done.    Patient Consent for Virtual Visit        Leslie Taylor has provided verbal consent on 06/12/2022 for a virtual visit (video or telephone).   CONSENT FOR VIRTUAL VISIT FOR:  Leslie Taylor  By participating in this virtual visit I agree to the following:  I hereby voluntarily request, consent and authorize [facility] and its employed or contracted physicians, physician assistants, nurse practitioners or other licensed health care professionals (the Practitioner), to provide me with telemedicine health care services (the "Services") as deemed necessary by the treating Practitioner. I acknowledge and consent to receive the Services by the Practitioner via telemedicine. I understand that the telemedicine visit will involve communicating with the Practitioner through live audiovisual communication technology and the disclosure of certain medical information by electronic transmission. I acknowledge that I have been given the opportunity to request an in-person assessment or other available alternative prior to the telemedicine visit and am voluntarily participating in the telemedicine visit.  I understand that I have the right to withhold or withdraw my consent to the use of telemedicine in the course of my care at any time, without affecting my right to future care or treatment, and that the Practitioner or I may terminate the telemedicine visit at any time. I understand that I have the right to inspect all information obtained and/or recorded in the course of the telemedicine visit and may receive copies of available information for a reasonable fee.  I understand that some of the potential risks of receiving the  Services via telemedicine include:  Delay or interruption in medical evaluation due to technological equipment failure or disruption; Information transmitted may not be sufficient (e.g. poor resolution of images) to allow for appropriate medical decision making by the Practitioner; and/or  In rare instances, security protocols could fail, causing a breach of personal health information.  Furthermore, I acknowledge that it is my responsibility to provide information about my medical history, conditions and care that is complete and accurate to the best of my ability. I acknowledge that Practitioner's advice, recommendations, and/or decision may be based on factors not within their control, such as incomplete or inaccurate data provided by me or distortions of diagnostic images or specimens that may result from electronic transmissions. I understand that the practice of medicine is not an exact science and that Practitioner makes no warranties or guarantees regarding treatment outcomes. I acknowledge that a copy of this consent can be made available to me via my patient portal ([place]), or I can request a printed copy by calling the office of [facility].    I understand that my insurance will be billed for this visit.   I have read or had this consent read to me. I understand the contents of this consent, which adequately explains the benefits and risks of the Services being provided via telemedicine.  I have been provided ample opportunity to ask questions regarding this consent and the Services and have had my questions answered to my satisfaction. I give my informed consent for the services to be provided through the use of telemedicine in my medical care

## 2022-06-12 NOTE — Telephone Encounter (Signed)
Add on 06/14/22 @ 10:40 due to pt has infection and needs dental procedure . As well as pt states she cannot proceed with her cardiac procedure  (Watchman, see 03/21/22 phone note from Lenice Llamas, RN), until her dental procedure has been done.    Med rec and consent are done.

## 2022-06-14 ENCOUNTER — Ambulatory Visit: Payer: Medicare Other | Attending: Cardiology | Admitting: Physician Assistant

## 2022-06-14 DIAGNOSIS — Z01810 Encounter for preprocedural cardiovascular examination: Secondary | ICD-10-CM

## 2022-06-14 NOTE — Progress Notes (Signed)
Virtual Visit via Telephone Note   Because of Leslie Taylor co-morbid illnesses, she is at least at moderate risk for complications without adequate follow up.  This format is felt to be most appropriate for this patient at this time.  The patient did not have access to video technology/had technical difficulties with video requiring transitioning to audio format only (telephone).  All issues noted in this document were discussed and addressed.  No physical exam could be performed with this format.  Please refer to the patient's chart for her consent to telehealth for [facility].  Evaluation Performed:  Preoperative cardiovascular risk assessment _____________   Date:  06/14/2022   Patient ID:  Leslie Taylor, Leslie Taylor 14-Dec-1955, MRN [id number] Patient Location:  Home Provider location:   Office  Primary Care Provider:  Shirline Frees, MD Primary Cardiologist:  Candee Furbish, MD  Chief Complaint / Patient Profile   66 y.o. y/o female with a h/o hypertension, anxiety, PAF status post ablationHypertension, anxiety, PAF s/p Ablation who is pending dental extractions and presents today for telephonic preoperative cardiovascular risk assessment.  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 ECHO 11/06/10 - normal EF, mild MR/TR   Paraesophageal hiatal hernia 2010   lap repair 2010 with biologic mesh   Resting tremor 05/26/2013   Past Surgical History:  Procedure Laterality Date   ATRIAL FIBRILLATION ABLATION N/A 12/15/2021   Procedure: ATRIAL  FIBRILLATION ABLATION;  Surgeon: Vickie Epley, MD;  Location: [facility];  Service: Cardiovascular;  Laterality: N/A;   CYSTOSCOPY/RETROGRADE/URETEROSCOPY/STONE EXTRACTION WITH BASKET  2003   Cystoscopy and urethral dilation, right retrograde pyelogram with   LAPAROSCOPIC CHOLECYSTECTOMY W/ CHOLANGIOGRAPHY  2010   LAPAROSCOPIC NISSEN FUNDOPLICATION  6644   LAPAROSCOPIC PARAESOPHAGEAL HERNIA REPAIR  2010   primery with biologic mesh reinforcement   SQUAMOUS CELL CARCINOMA EXCISION  2005    Allergies  Allergies  Allergen Reactions   Propranolol Hcl Other (See Comments)    Fatigue    Shellfish-Derived Products Nausea And Vomiting    Flu like symptoms    Wellbutrin [Bupropion]     Insomnia     History of Present Illness    Leslie Taylor is a 66 y.o. female who presents via audio/video conferencing for a telehealth visit today.  Pt was last seen in cardiology clinic on 03/19/22 by Dr. Quentin Ore.  At that time Leslie Taylor was doing well .  The patient is now pending procedure as outlined above. Since her last visit, she has been feeling great without any chest pains or SOB. She tells me the ablation helped with her Afib and she felt immediately better after. Her knees bother her from time to time but other than that she can do stairs, walk okay, and do all indoor and outdoor tasks. For this reason she has scored a 5.62 on the DASI, This exceeds the minimum requirement of 4 mets. She also tells me she is  looking forward to undergoing the Watchman procedure so she can eventually come off her anticoagulation.   We discussed holding Eliquis x 1 day prior to dental extractions and resuming when medically safe to do so.   Home Medications    Prior to Admission medications   Medication Sig Start Date End Date Taking? Authorizing Provider  ALPRAZolam Duanne Moron) 0.5 MG tablet Take 0.5 mg by mouth at bedtime.    [provider]  apixaban (ELIQUIS) 5 MG TABS tablet Take 1  tablet (5 mg total) by mouth 2 (two) times daily. 12/11/21   Vickie Epley, MD  Calcium Carb-Cholecalciferol (CALCIUM 600 + D PO) Take 1 tablet by mouth daily.    [provider]  diltiazem (CARDIZEM) 60 MG tablet Take 1 tablet (60 mg total) by mouth 4 (four) times daily as needed. Patient not taking: Reported on 06/12/2022 05/25/20   Jerline Pain, MD  Magnesium 500 MG TABS Take 500 mg by mouth daily.    [provider]  metoprolol succinate (TOPROL-XL) 50 MG 24 hr tablet Take 50 mg by mouth at bedtime. 02/15/21   [provider]  Multiple Vitamin (MULTIVITAMIN) tablet Take 1 tablet by mouth daily.    [provider]  rosuvastatin (CRESTOR) 10 MG tablet Take 1 tablet (10 mg total) by mouth daily. 01/12/22   Jerline Pain, MD  sodium chloride (OCEAN) 0.65 % SOLN nasal spray Place 1 spray into both nostrils as needed for congestion.    [provider]  traMADol (ULTRAM) 50 MG tablet Take 1-2 tablets (50-100 mg total) by mouth every 8 (eight) hours as needed. Patient not taking: Reported on 06/12/2022 08/21/13   Leandrew Koyanagi, MD    Physical Exam    Vital Signs:  [name] does not have vital signs available for review today.  Given telephonic nature of communication, physical exam is limited. AAOx3. NAD. Normal affect.  Speech and respirations are unlabored.  Accessory Clinical Findings    None  Assessment & Plan    1.  Preoperative Cardiovascular Risk Assessment:  Leslie Taylor perioperative risk of a major cardiac event is 0.4% according to the Revised Cardiac Risk Index (RCRI).  Therefore, she is at low risk for perioperative complications.   Her functional capacity is good at 5.62 METs according to the [place] Activity Status Index (DASI). Recommendations: According to ACC/AHA guidelines, no further cardiovascular testing needed.  The patient may proceed to surgery at acceptable risk.   Antiplatelet and/or Anticoagulation  Recommendations:  Eliquis (Apixaban) can be held for 1 days prior to surgery.  Please resume post op when felt to be safe.    No SBE prophylaxis required  A copy of this note will be routed to requesting surgeon.  Time:   Today, I have spent 10 minutes with the patient with telehealth technology discussing medical history, symptoms, and management plan.     Elgie Collard, PA-C  06/14/2022, 10:46 AM

## 2022-06-19 NOTE — Telephone Encounter (Signed)
Patient is following up regarding dental clearance. She states the dentist office has not received the clearance request and they need it ASAP.

## 2022-06-19 NOTE — Telephone Encounter (Signed)
I will re-fax clearance notes to [facility]ds office

## 2022-06-22 ENCOUNTER — Telehealth: Payer: Self-pay | Admitting: Cardiology

## 2022-06-22 NOTE — Telephone Encounter (Signed)
   Patient called to confirm that she will have several teeth extracted next week, 06/27/22 and would like to proceed in the Watchman line up.   Thanks! JM

## 2022-06-24 ENCOUNTER — Other Ambulatory Visit: Payer: Self-pay | Admitting: Cardiology

## 2022-06-25 NOTE — Telephone Encounter (Signed)
Prescription refill request for Eliquis received. Indication:Afib Last office visit:10/23 Scr:0.8 Age: 66 Weight:88.5 kg  Prescription refilled

## 2022-06-25 NOTE — Telephone Encounter (Signed)
Left message to call back  

## 2022-06-26 NOTE — Telephone Encounter (Signed)
The patient wishes to proceed with LAAO on 09/13/22.  Scheduled her for pre-procedure visit on 09/05/2022.  She understands she will get instructions, lab work, and special soap at that visit. She was grateful for call and agreed with plan.

## 2022-06-27 ENCOUNTER — Other Ambulatory Visit: Payer: Self-pay

## 2022-06-27 DIAGNOSIS — Z7901 Long term (current) use of anticoagulants: Secondary | ICD-10-CM

## 2022-06-27 DIAGNOSIS — I480 Paroxysmal atrial fibrillation: Secondary | ICD-10-CM

## 2022-07-02 DIAGNOSIS — I10 Essential (primary) hypertension: Secondary | ICD-10-CM | POA: Diagnosis not present

## 2022-07-02 DIAGNOSIS — F5101 Primary insomnia: Secondary | ICD-10-CM | POA: Diagnosis not present

## 2022-07-02 DIAGNOSIS — E7800 Pure hypercholesterolemia, unspecified: Secondary | ICD-10-CM | POA: Diagnosis not present

## 2022-07-02 DIAGNOSIS — I480 Paroxysmal atrial fibrillation: Secondary | ICD-10-CM | POA: Diagnosis not present

## 2022-08-06 DIAGNOSIS — H524 Presbyopia: Secondary | ICD-10-CM | POA: Diagnosis not present

## 2022-08-19 DIAGNOSIS — Z0100 Encounter for examination of eyes and vision without abnormal findings: Secondary | ICD-10-CM | POA: Diagnosis not present

## 2022-09-04 NOTE — Progress Notes (Unsigned)
[facility]                                     Cardiology Office Note:    Date:  09/05/2022   ID:  Leslie Taylor, DOB [birth year], MRN [id number]  PCP:  Leslie Frees, MD  [facility] Cardiologist:  Leslie Furbish, MD  [facility] Electrophysiologist:  Leslie Epley, MD   Referring MD: Leslie Frees, MD   Chief Complaint  Patient presents with   Follow-up    Pre LAAO    History of Present Illness:    Leslie Taylor is a 67 y.o. female with a hx of atrial fibrillation s/p AF ablation with Dr. Quentin Taylor 12/15/21, HTN, and anxiety who presents today for pre-op evaluation prior to Watchman implantation scheduled for 09/13/22.   Leslie Taylor was seen by Dr. Quentin Taylor for atrial fibrillation and underwent successful AF ablation 12/15/21. She was seen by Leslie Peals, PA shortly after the procedure and wished to discuss LAAO closure with Watchman to avoid long term anticoagulation due to hx of epistaxis.   CT imaging showed anatomy suitable for Watchman implant. She is here today for pre LAAO evaluation. She is doing well with no complaints of chest pain, palpitations, LE edema, dizziness, or syncope. No recent bleeding on Eliquis in stool or urine.   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 ECHO 11/06/10 - normal EF, mild MR/TR   Paraesophageal hiatal hernia 2010   lap repair 2010 with biologic mesh   Resting tremor 05/26/2013    Past Surgical History:  Procedure Laterality Date   ATRIAL FIBRILLATION ABLATION N/A 12/15/2021   Procedure: ATRIAL  FIBRILLATION ABLATION;  Surgeon: Leslie Epley, MD;  Location: [facility];  Service: Cardiovascular;  Laterality: N/A;   CYSTOSCOPY/RETROGRADE/URETEROSCOPY/STONE EXTRACTION WITH BASKET  2003   Cystoscopy and urethral dilation, right retrograde pyelogram with   LAPAROSCOPIC CHOLECYSTECTOMY W/ CHOLANGIOGRAPHY  2010   LAPAROSCOPIC NISSEN FUNDOPLICATION  9629   LAPAROSCOPIC PARAESOPHAGEAL HERNIA REPAIR  2010   primery with biologic mesh reinforcement   SQUAMOUS CELL CARCINOMA EXCISION  2005    Current Medications: Current Meds  Medication Sig   Calcium Carb-Cholecalciferol (CALCIUM 600 + D PO) Take 1 tablet by mouth daily.   diltiazem (CARDIZEM) 60 MG tablet Take 1 tablet (60 mg total) by mouth 4 (four) times daily as needed.   ELIQUIS 5 MG TABS tablet TAKE ONE TABLET BY MOUTH TWICE A DAY   Magnesium 500 MG TABS Take 500 mg by mouth daily.   metoprolol succinate (TOPROL-XL) 50 MG 24 hr tablet Take 50 mg by mouth at bedtime.   Multiple Vitamin (MULTIVITAMIN) tablet Take 1 tablet by mouth daily.   rosuvastatin (CRESTOR) 10 MG tablet Take 1 tablet (10 mg total) by mouth daily.   sodium chloride (OCEAN) 0.65 % SOLN nasal spray Place 1 spray into both nostrils as needed for  congestion.   traMADol (ULTRAM) 50 MG tablet Take 1-2 tablets (50-100 mg total) by mouth every 8 (eight) hours as needed.     Allergies:   Propranolol hcl, Shellfish-derived products, and Wellbutrin [bupropion]   Social History   Socioeconomic History   Marital status: Married    Spouse name: Not on file   Number of children: Not on file   Years of education: Not on file   Highest education level: Not on file  Occupational History   Not on file  Tobacco Use   Smoking status: Never   Smokeless tobacco: Never  Substance and Sexual Activity   Alcohol use: Yes    Comment: occ   Drug use: No   Sexual activity: Not on file  Other Topics Concern   Not on file  Social History Narrative   Not on file    Social Determinants of Health   Financial Resource Strain: Not on file  Food Insecurity: Not on file  Transportation Needs: Not on file  Physical Activity: Not on file  Stress: Not on file  Social Connections: Not on file   Family History: The patient's family history includes Cancer in her father.  ROS:   Please see the history of present illness.    All other systems reviewed and are negative.  EKGs/Labs/Other Studies Reviewed:    The following studies were reviewed today:  CT pulm/morph 12/07/21:  IMPRESSION: 1. Left atrial appendage is windsock morphology with landing zone measuring 55m x 274min diameter with depth 3329mappropriate for placement of 26m69mtchman FLX device   2. There is normal pulmonary vein drainage into the left atrium. Measurements as reported   3.  There is no thrombus in the left atrial appendage.   4. Esophagus courses posterior to ostium of left lower pulmonary vein   5.  Coronary calcium score 138 (83rd percentile)  EKG:  EKG is ordered today.  The ekg ordered today demonstrates SR with HR 97bpm.   Recent Labs: 12/01/2021: BUN 11; Creatinine, Ser 0.86; Hemoglobin 12.1; Platelets 245; Potassium 4.0; Sodium 142   Recent Lipid Panel    Component Value Date/Time   CHOL 148 03/14/2022 1151   TRIG 127 03/14/2022 1151   HDL 54 03/14/2022 1151   CHOLHDL 2.7 03/14/2022 1151   CHOLHDL 3.7 03/14/2009 0515   VLDL 18 03/14/2009 0515   LDLCALC 72 03/14/2022 1151   Risk Assessment/Calculations:    HAS-BLED score 3 Hypertension Yes  Abnormal renal and liver function (Dialysis, transplant, Cr >2.26 mg/dL /Cirrhosis or Bilirubin >2x Normal or AST/ALT/AP >3x Normal) No  Stroke No  Bleeding Yes  Labile INR (Unstable/high INR) No  Elderly (>65) Yes  Drugs or alcohol (? 8 drinks/week, anti-plt or NSAID) No    CHA2DS2-VASc Score = 3  The patient's score is based upon: CHF History: 0 HTN History: 1 Diabetes History: 0 Stroke History:  0 Vascular Disease History: 0 Age Score: 1 Gender Score: 1   Physical Exam:    VS:  BP 126/80   Pulse 97   Ht '5\' 11"'$  (1.803 m)   Wt 202 lb 3.2 oz (91.7 kg)   SpO2 94%   BMI 28.20 kg/m     Wt Readings from Last 3 Encounters:  09/05/22 202 lb 3.2 oz (91.7 kg)  03/19/22 195 lb 3.2 oz (88.5 kg)  01/03/22 208 lb (94.3 kg)    General: Well developed, well nourished, NAD Lungs:Clear to ausculation bilaterally. No wheezes, rales, or rhonchi. Breathing is  unlabored. Cardiovascular: RRR with S1 S2. No murmur Extremities: No edema. Neuro: Alert and oriented. No focal deficits. No facial asymmetry. MAE spontaneously. Psych: Responds to questions appropriately with normal affect.    ASSESSMENT/PLAN:    PAF: Referred to Dr. Quentin Taylor for AF ablation, performed 12/2021. In follow up, she had interest in [place] closure with Watchman, scheduled for 09/13/22. Pre procedure instructions reviewed with understanding. CHG soap given. Obtain BMET, CBC today.    Medication Adjustments/Labs and Tests Ordered: Current medicines are reviewed at length with the patient today.  Concerns regarding medicines are outlined above.  Orders Placed This Encounter  Procedures   CBC   Basic metabolic panel   EKG 21-YYQM   No orders of the defined types were placed in this encounter.   Patient Instructions  Medication Instructions:  Your physician recommends that you continue on your current medications as directed. Please refer to the Current Medication list given to you today.  *If you need a refill on your cardiac medications before your next appointment, please call your pharmacy*   Lab Work: TODAY: BMET, CBC If you have labs (blood work) drawn today and your tests are completely normal, you will receive your results only by: [place] (if you have MyChart) OR A paper copy in the mail If you have any lab test that is abnormal or we need to change your treatment, we will call you to review  the results.   Testing/Procedures: SEE INSTRUCTION LETTER FOR WATCHMAN   Follow-Up: At [facility], you and your health needs are our priority.  As part of our continuing mission to provide you with exceptional heart care, we have created designated Provider Care Teams.  These Care Teams include your primary Cardiologist (physician) and Advanced Practice Providers (APPs -  Physician Assistants and Nurse Practitioners) who all work together to provide you with the care you need, when you need it.  We recommend signing up for the patient portal called "MyChart".  Sign up information is provided on this After Visit Summary.  MyChart is used to connect with patients for Virtual Visits (Telemedicine).  Patients are able to view lab/test results, encounter notes, upcoming appointments, etc.  Non-urgent messages can be sent to your provider as well.   To learn more about what you can do with MyChart, go to [email or web address].    Your next appointment:   KEEP SCHEDULED FOLLOW-UP  Important Information About Sugar         Signed, Kathyrn Drown, NP  09/05/2022 1:34 PM    [place]

## 2022-09-05 ENCOUNTER — Ambulatory Visit: Payer: Medicare Other | Attending: Cardiology | Admitting: Cardiology

## 2022-09-05 VITALS — BP 126/80 | HR 97 | Ht 71.0 in | Wt 202.2 lb

## 2022-09-05 DIAGNOSIS — R040 Epistaxis: Secondary | ICD-10-CM | POA: Diagnosis not present

## 2022-09-05 DIAGNOSIS — Z7901 Long term (current) use of anticoagulants: Secondary | ICD-10-CM

## 2022-09-05 DIAGNOSIS — Z01818 Encounter for other preprocedural examination: Secondary | ICD-10-CM | POA: Diagnosis not present

## 2022-09-05 DIAGNOSIS — I480 Paroxysmal atrial fibrillation: Secondary | ICD-10-CM

## 2022-09-05 LAB — CBC

## 2022-09-05 NOTE — Patient Instructions (Signed)
Medication Instructions:  Your physician recommends that you continue on your current medications as directed. Please refer to the Current Medication list given to you today.  *If you need a refill on your cardiac medications before your next appointment, please call your pharmacy*   Lab Work: TODAY: BMET, CBC If you have labs (blood work) drawn today and your tests are completely normal, you will receive your results only by: [place] (if you have MyChart) OR A paper copy in the mail If you have any lab test that is abnormal or we need to change your treatment, we will call you to review the results.   Testing/Procedures: SEE INSTRUCTION LETTER FOR WATCHMAN   Follow-Up: At [facility], you and your health needs are our priority.  As part of our continuing mission to provide you with exceptional heart care, we have created designated Provider Care Teams.  These Care Teams include your primary Cardiologist (physician) and Advanced Practice Providers (APPs -  Physician Assistants and Nurse Practitioners) who all work together to provide you with the care you need, when you need it.  We recommend signing up for the patient portal called "MyChart".  Sign up information is provided on this After Visit Summary.  MyChart is used to connect with patients for Virtual Visits (Telemedicine).  Patients are able to view lab/test results, encounter notes, upcoming appointments, etc.  Non-urgent messages can be sent to your provider as well.   To learn more about what you can do with MyChart, go to [email or web address].    Your next appointment:   KEEP SCHEDULED FOLLOW-UP  Important Information About Sugar

## 2022-09-06 LAB — BASIC METABOLIC PANEL
BUN/Creatinine Ratio: 20 (ref 12–28)
BUN: 21 mg/dL (ref 8–27)
CO2: 25 mmol/L (ref 20–29)
Calcium: 9.7 mg/dL (ref 8.7–10.3)
Chloride: 99 mmol/L (ref 96–106)
Creatinine, Ser: 1.07 mg/dL — ABNORMAL HIGH (ref 0.57–1.00)
Glucose: 103 mg/dL — ABNORMAL HIGH (ref 70–99)
Potassium: 4.5 mmol/L (ref 3.5–5.2)
Sodium: 138 mmol/L (ref 134–144)
eGFR: 57 mL/min/{1.73_m2} — ABNORMAL LOW (ref >59)

## 2022-09-06 LAB — CBC
Hematocrit: 37.6 % (ref 34.0–46.6)
Hemoglobin: 12.4 g/dL (ref 11.1–15.9)
MCH: 30.2 pg (ref 26.6–33.0)
MCHC: 33 g/dL (ref 31.5–35.7)
MCV: 92 fL (ref 79–97)
Platelets: 286 10*3/uL (ref 150–450)
RBC: 4.1 x10E6/uL (ref 3.77–5.28)
RDW: 12.3 % (ref 11.7–15.4)
WBC: 7 10*3/uL (ref 3.4–10.8)

## 2022-09-12 ENCOUNTER — Telehealth: Payer: Self-pay

## 2022-09-12 NOTE — Telephone Encounter (Signed)
Left message to call back  

## 2022-09-12 NOTE — Telephone Encounter (Signed)
Informed the patient [employer] has not yet authorized her procedure for tomorrow.  She understands that her procedure is being cancelled and will be rescheduled for a later date. She understands she will be called tomorrow to confirm a new plan.

## 2022-09-13 DIAGNOSIS — I4891 Unspecified atrial fibrillation: Secondary | ICD-10-CM

## 2022-09-13 NOTE — Telephone Encounter (Signed)
Rescheduled the patient's LAAO procedure from today (cancelled last night) to 09/27/2022. Will send instructions via MyChart. The patient understands she will be notified when authorization is received. She was grateful for call and agreed with plan.

## 2022-09-25 ENCOUNTER — Telehealth: Payer: Self-pay

## 2022-09-25 NOTE — Telephone Encounter (Signed)
Left message to call back.

## 2022-09-25 NOTE — Progress Notes (Signed)
Patient will have surgery on Thursday, January 25th, at 51: 78 o'clock with Dr. Quentin Ore. Patient will be at the hospital at 11:00 o'clock. Patient received pre-procedure instructions. Patient was instructed to call if he will have questions/concerns prior to procedure.

## 2022-09-26 NOTE — Telephone Encounter (Signed)
Confirmed procedure date of 09/27/2022. Confirmed arrival time of 1100 for procedure time at 1330. Reviewed pre-procedure instructions with patient. The patient understands to call if questions/concerns arise prior to procedure.

## 2022-09-27 ENCOUNTER — Inpatient Hospital Stay (HOSPITAL_COMMUNITY): Payer: Medicare Other

## 2022-09-27 ENCOUNTER — Encounter (HOSPITAL_COMMUNITY)
Admission: RE | Disposition: A | Discharge status: Home / Self Care | Payer: Self-pay | Source: Ambulatory Visit | Attending: Cardiology

## 2022-09-27 ENCOUNTER — Encounter (HOSPITAL_COMMUNITY): Payer: Self-pay | Admitting: Cardiology

## 2022-09-27 ENCOUNTER — Other Ambulatory Visit: Payer: Self-pay

## 2022-09-27 ENCOUNTER — Inpatient Hospital Stay (HOSPITAL_COMMUNITY): Payer: Medicare Other | Admitting: Certified Registered"

## 2022-09-27 ENCOUNTER — Inpatient Hospital Stay (HOSPITAL_COMMUNITY)
Admission: RE | Admit: 2022-09-27 | Discharge: 2022-09-28 | DRG: 274 | Disposition: A | Discharge status: Home / Self Care | Payer: Medicare Other | Source: Ambulatory Visit | Attending: Cardiology | Admitting: Cardiology

## 2022-09-27 DIAGNOSIS — I4891 Unspecified atrial fibrillation: Secondary | ICD-10-CM | POA: Diagnosis not present

## 2022-09-27 DIAGNOSIS — F419 Anxiety disorder, unspecified: Secondary | ICD-10-CM | POA: Diagnosis present

## 2022-09-27 DIAGNOSIS — I10 Essential (primary) hypertension: Secondary | ICD-10-CM | POA: Diagnosis not present

## 2022-09-27 DIAGNOSIS — D649 Anemia, unspecified: Secondary | ICD-10-CM

## 2022-09-27 DIAGNOSIS — Z7901 Long term (current) use of anticoagulants: Secondary | ICD-10-CM

## 2022-09-27 DIAGNOSIS — Z006 Encounter for examination for normal comparison and control in clinical research program: Secondary | ICD-10-CM

## 2022-09-27 DIAGNOSIS — Z809 Family history of malignant neoplasm, unspecified: Secondary | ICD-10-CM

## 2022-09-27 DIAGNOSIS — I480 Paroxysmal atrial fibrillation: Secondary | ICD-10-CM

## 2022-09-27 DIAGNOSIS — I088 Other rheumatic multiple valve diseases: Secondary | ICD-10-CM | POA: Diagnosis not present

## 2022-09-27 DIAGNOSIS — Z95818 Presence of other cardiac implants and grafts: Secondary | ICD-10-CM

## 2022-09-27 DIAGNOSIS — R040 Epistaxis: Secondary | ICD-10-CM | POA: Diagnosis present

## 2022-09-27 DIAGNOSIS — Z01818 Encounter for other preprocedural examination: Secondary | ICD-10-CM | POA: Diagnosis not present

## 2022-09-27 HISTORY — DX: Other specified postprocedural states: Z98.890

## 2022-09-27 HISTORY — PX: LEFT ATRIAL APPENDAGE OCCLUSION: EP1229

## 2022-09-27 HISTORY — DX: Presence of other cardiac implants and grafts: Z95.818

## 2022-09-27 HISTORY — PX: TEE WITHOUT CARDIOVERSION: SHX5443

## 2022-09-27 LAB — SURGICAL PCR SCREEN
MRSA, PCR: NEGATIVE
Staphylococcus aureus: NEGATIVE

## 2022-09-27 LAB — POCT ACTIVATED CLOTTING TIME: Activated Clotting Time: 304 seconds

## 2022-09-27 LAB — TYPE AND SCREEN
ABO/RH(D): A NEG
Antibody Screen: NEGATIVE

## 2022-09-27 LAB — ABO/RH: ABO/RH(D): A NEG

## 2022-09-27 LAB — ECHO TEE

## 2022-09-27 SURGERY — LEFT ATRIAL APPENDAGE OCCLUSION
Anesthesia: General

## 2022-09-27 MED ORDER — LACTATED RINGERS IV SOLN: INTRAVENOUS | Status: DC

## 2022-09-27 MED ORDER — SODIUM CHLORIDE 0.9% FLUSH
3.0000 mL | Freq: Two times a day (BID) | INTRAVENOUS | Status: DC
  Administered 2022-09-27: 3 mL via INTRAVENOUS

## 2022-09-27 MED ORDER — CHLORHEXIDINE GLUCONATE 4 % EX LIQD: Freq: Once | CUTANEOUS | Status: DC

## 2022-09-27 MED ORDER — HEPARIN (PORCINE) IN NACL 1000-0.9 UT/500ML-% IV SOLN
INTRAVENOUS | Status: DC | PRN
  Administered 2022-09-27 (×2): 500 mL

## 2022-09-27 MED ORDER — PHENYLEPHRINE 80 MCG/ML (10ML) SYRINGE FOR IV PUSH (FOR BLOOD PRESSURE SUPPORT)
PREFILLED_SYRINGE | INTRAVENOUS | Status: DC | PRN
  Administered 2022-09-27: 240 ug via INTRAVENOUS
  Administered 2022-09-27: 80 ug via INTRAVENOUS
  Administered 2022-09-27: 160 ug via INTRAVENOUS

## 2022-09-27 MED ORDER — MIDAZOLAM HCL 2 MG/2ML IJ SOLN
INTRAMUSCULAR | Status: DC | PRN
  Administered 2022-09-27: 2 mg via INTRAVENOUS

## 2022-09-27 MED ORDER — ONDANSETRON HCL 4 MG/2ML IJ SOLN
INTRAMUSCULAR | Status: DC | PRN
  Administered 2022-09-27: 4 mg via INTRAVENOUS

## 2022-09-27 MED ORDER — CHLORHEXIDINE GLUCONATE 0.12 % MT SOLN
OROMUCOSAL | Status: AC
  Filled 2022-09-27: qty 15

## 2022-09-27 MED ORDER — HEPARIN (PORCINE) IN NACL 1000-0.9 UT/500ML-% IV SOLN
INTRAVENOUS | Status: AC
  Filled 2022-09-27: qty 500

## 2022-09-27 MED ORDER — PROTAMINE SULFATE 10 MG/ML IV SOLN
INTRAVENOUS | Status: DC | PRN
  Administered 2022-09-27: 30 mg via INTRAVENOUS

## 2022-09-27 MED ORDER — DEXAMETHASONE SODIUM PHOSPHATE 10 MG/ML IJ SOLN
INTRAMUSCULAR | Status: DC | PRN
  Administered 2022-09-27: 10 mg via INTRAVENOUS

## 2022-09-27 MED ORDER — HEPARIN (PORCINE) IN NACL 2000-0.9 UNIT/L-% IV SOLN
INTRAVENOUS | Status: DC | PRN
  Administered 2022-09-27 (×2): 1000 mL

## 2022-09-27 MED ORDER — ROSUVASTATIN CALCIUM 5 MG PO TABS
10.0000 mg | ORAL_TABLET | Freq: Every day | ORAL | Status: DC
  Administered 2022-09-27: 10 mg via ORAL
  Filled 2022-09-27: qty 2

## 2022-09-27 MED ORDER — HEPARIN SODIUM (PORCINE) 1000 UNIT/ML IJ SOLN
INTRAMUSCULAR | Status: DC | PRN
  Administered 2022-09-27: 13000 [IU] via INTRAVENOUS

## 2022-09-27 MED ORDER — SUGAMMADEX SODIUM 200 MG/2ML IV SOLN
INTRAVENOUS | Status: DC | PRN
  Administered 2022-09-27: 200 mg via INTRAVENOUS

## 2022-09-27 MED ORDER — HEPARIN (PORCINE) IN NACL 2000-0.9 UNIT/L-% IV SOLN
INTRAVENOUS | Status: AC
  Filled 2022-09-27: qty 1000

## 2022-09-27 MED ORDER — ZOLPIDEM TARTRATE 5 MG PO TABS
5.0000 mg | ORAL_TABLET | Freq: Every day | ORAL | Status: DC
  Administered 2022-09-27: 5 mg via ORAL
  Filled 2022-09-27: qty 1

## 2022-09-27 MED ORDER — ACETAMINOPHEN 325 MG PO TABS
650.0000 mg | ORAL_TABLET | ORAL | Status: DC | PRN
  Filled 2022-09-27: qty 2

## 2022-09-27 MED ORDER — CHLORHEXIDINE GLUCONATE 0.12 % MT SOLN
15.0000 mL | Freq: Once | OROMUCOSAL | Status: AC
  Administered 2022-09-27: 15 mL via OROMUCOSAL
  Filled 2022-09-27: qty 15

## 2022-09-27 MED ORDER — APIXABAN 5 MG PO TABS
5.0000 mg | ORAL_TABLET | Freq: Two times a day (BID) | ORAL | Status: DC
  Administered 2022-09-27: 5 mg via ORAL
  Filled 2022-09-27: qty 1

## 2022-09-27 MED ORDER — SODIUM CHLORIDE 0.9 % IV SOLN: INTRAVENOUS | Status: DC

## 2022-09-27 MED ORDER — PROPOFOL 10 MG/ML IV BOLUS
INTRAVENOUS | Status: DC | PRN
  Administered 2022-09-27: 110 mg via INTRAVENOUS

## 2022-09-27 MED ORDER — CEFAZOLIN SODIUM-DEXTROSE 2-4 GM/100ML-% IV SOLN
2.0000 g | INTRAVENOUS | Status: AC
  Administered 2022-09-27: 2 g via INTRAVENOUS
  Filled 2022-09-27: qty 100

## 2022-09-27 MED ORDER — METOPROLOL SUCCINATE ER 50 MG PO TB24
50.0000 mg | ORAL_TABLET | Freq: Every day | ORAL | Status: DC
  Administered 2022-09-27: 50 mg via ORAL
  Filled 2022-09-27: qty 1

## 2022-09-27 MED ORDER — FENTANYL CITRATE (PF) 100 MCG/2ML IJ SOLN
INTRAMUSCULAR | Status: DC | PRN
  Administered 2022-09-27: 100 ug via INTRAVENOUS

## 2022-09-27 MED ORDER — ROCURONIUM BROMIDE 10 MG/ML (PF) SYRINGE
PREFILLED_SYRINGE | INTRAVENOUS | Status: DC | PRN
  Administered 2022-09-27: 20 mg via INTRAVENOUS
  Administered 2022-09-27: 50 mg via INTRAVENOUS

## 2022-09-27 MED ORDER — LIDOCAINE 2% (20 MG/ML) 5 ML SYRINGE
INTRAMUSCULAR | Status: DC | PRN
  Administered 2022-09-27: 60 mg via INTRAVENOUS

## 2022-09-27 MED ORDER — ONDANSETRON HCL 4 MG/2ML IJ SOLN: 4.0000 mg | Freq: Four times a day (QID) | INTRAMUSCULAR | Status: DC | PRN

## 2022-09-27 SURGICAL SUPPLY — 22 items
BLANKET WARM UNDERBOD FULL ACC (MISCELLANEOUS) ×1 IMPLANT
CATH DIAG 6FR PIGTAIL ANGLED (CATHETERS) IMPLANT
CLOSURE PERCLOSE PROSTYLE (VASCULAR PRODUCTS) IMPLANT
DEVICE WATCHMAN FLX PROC (KITS) IMPLANT
DILATOR VESSEL 38 20CM 11FR (INTRODUCER) IMPLANT
DILATOR VESSEL 38 20CM 16FR (INTRODUCER) IMPLANT
KIT HEART LEFT (KITS) ×1 IMPLANT
KIT SHEA VERSACROSS LAAC CONNE (KITS) IMPLANT
PACK CARDIAC CATHETERIZATION (CUSTOM PROCEDURE TRAY) ×1 IMPLANT
PAD DEFIB RADIO PHYSIO CONN (PAD) ×1 IMPLANT
PROTECTION STATION PRESSURIZED (MISCELLANEOUS) ×1
SHEATH PERFORMER 16FR 30 (SHEATH) IMPLANT
SHEATH PINNACLE 8F 10CM (SHEATH) IMPLANT
SHEATH PROBE COVER 6X72 (BAG) ×1 IMPLANT
STATION PROTECTION PRESSURIZED (MISCELLANEOUS) IMPLANT
SYS WATCHMAN FXD DBL (SHEATH) ×1
SYSTEM WATCHMAN FXD DBL (SHEATH) IMPLANT
TRANSDUCER W/STOPCOCK (MISCELLANEOUS) ×1 IMPLANT
TUBING CIL FLEX 10 FLL-RA (TUBING) ×1 IMPLANT
WATCHMAN FLX 35 (Prosthesis & Implant Heart) IMPLANT
WATCHMAN FLX PROCEDURE DEVICE (KITS) ×1 IMPLANT
WATCHMAN PROCED TRUSEAL ACCESS (SHEATH) IMPLANT

## 2022-09-27 NOTE — Progress Notes (Signed)
  [place] TEAM  Patient doing well s/p LAAO closure with Watchman. She is hemodynamically stable. Groin site is stable. Plan for early ambulation after bedrest completed and hopeful discharge over the next 24 hours.   Kathyrn Drown NP-C Structural Heart Team  Pager: [phone] Phone: [phone]

## 2022-09-27 NOTE — Anesthesia Preprocedure Evaluation (Addendum)
Anesthesia Evaluation  Patient identified by MRN, date of birth, ID band Patient awake    Reviewed: Allergy & Precautions, NPO status , Patient's Chart, lab work & pertinent test results  History of Anesthesia Complications (+) PONV and history of anesthetic complications  Airway Mallampati: II  TM Distance: >3 FB Neck ROM: Full    Dental no notable dental hx.    Pulmonary neg pulmonary ROS   Pulmonary exam normal        Cardiovascular hypertension, Pt. on medications and Pt. on home beta blockers + dysrhythmias Atrial Fibrillation  Rhythm:Irregular Rate:Normal     Neuro/Psych   Anxiety     negative neurological ROS     GI/Hepatic Neg liver ROS, hiatal hernia,,,  Endo/Other  negative endocrine ROS    Renal/GU   negative genitourinary   Musculoskeletal negative musculoskeletal ROS (+)    Abdominal Normal abdominal exam  (+)   Peds  Hematology  (+) Blood dyscrasia, anemia   Anesthesia Other Findings   Reproductive/Obstetrics                             Anesthesia Physical Anesthesia Plan  ASA: 3  Anesthesia Plan: General   Post-op Pain Management:    Induction: Intravenous  PONV Risk Score and Plan: 4 or greater and Ondansetron, Dexamethasone, Midazolam and Treatment may vary due to age or medical condition  Airway Management Planned: Mask and Oral ETT  Additional Equipment: ClearSight  Intra-op Plan:   Post-operative Plan: Extubation in OR  Informed Consent: I have reviewed the patients History and Physical, chart, labs and discussed the procedure including the risks, benefits and alternatives for the proposed anesthesia with the patient or authorized representative who has indicated his/her understanding and acceptance.     Dental advisory given  Plan Discussed with: CRNA  Anesthesia Plan Comments: (Lab Results      Component                Value               Date                       WBC                      7.0                 09/05/2022                HGB                      12.4                09/05/2022                HCT                      37.6                09/05/2022                MCV                      92                  09/05/2022  PLT                      286                 09/05/2022           )       Anesthesia Quick Evaluation

## 2022-09-27 NOTE — Transfer of Care (Signed)
Immediate Anesthesia Transfer of Care Note  Patient: Leslie Taylor  Procedure(s) Performed: LEFT ATRIAL APPENDAGE OCCLUSION TRANSESOPHAGEAL ECHOCARDIOGRAM (TEE)  Patient Location: Cath Lab  Anesthesia Type:General  Level of Consciousness: awake, alert , and oriented  Airway & Oxygen Therapy: Patient Spontanous Breathing and Patient connected to nasal cannula oxygen  Post-op Assessment: Report given to RN  Post vital signs: Reviewed and stable  Last Vitals:  Vitals Value Taken Time  BP 132/100 09/27/22 1504  Temp    Pulse 87 09/27/22 1505  Resp 19 09/27/22 1505  SpO2 100 % 09/27/22 1505  Vitals shown include unvalidated device data.  Last Pain:  Vitals:   09/27/22 1146  TempSrc:   PainSc: 0-No pain      Patients Stated Pain Goal: 0 (43/20/03 7944)  Complications:  Encounter Notable Events  Notable Event Outcome Phase Comment  Difficult to intubate - expected  Intraprocedure Filed from anesthesia note documentation.

## 2022-09-27 NOTE — Anesthesia Procedure Notes (Signed)
Procedure Name: Intubation Date/Time: 09/27/2022 1:40 PM  Performed by: Barrington Ellison, CRNAPre-anesthesia Checklist: Patient identified, Emergency Drugs available, Suction available and Patient being monitored Patient Re-evaluated:Patient Re-evaluated prior to induction Oxygen Delivery Method: Circle System Utilized Preoxygenation: Pre-oxygenation with 100% oxygen Induction Type: IV induction Ventilation: Mask ventilation without difficulty Laryngoscope Size: Glidescope and 3 Grade View: Grade I Tube type: Oral Tube size: 7.0 mm Number of attempts: 1 Airway Equipment and Method: Stylet and Oral airway Placement Confirmation: ETT inserted through vocal cords under direct vision, positive ETCO2 and breath sounds checked- equal and bilateral Secured at: 22 cm Tube secured with: Tape Dental Injury: Teeth and Oropharynx as per pre-operative assessment  Difficulty Due To: Difficulty was anticipated and Difficult Airway- due to limited oral opening Future Recommendations: Recommend- induction with short-acting agent, and alternative techniques readily available Comments: Very small oral opening, use Glidescope

## 2022-09-27 NOTE — H&P (Signed)
Electrophysiology Office Follow up Visit Note:     Date:  09/27/2022    ID:  Tesla, Keeler [birth year], MRN [id number]   PCP:  Shirline Frees, MD       [facility] Cardiologist:  Candee Furbish, MD  [facility] Electrophysiologist:  Vickie Epley, MD      Interval History:     Leslie Taylor is a 67 y.o. female who presents for a follow up visit after an A-fib ablation on December 15, 2021.  During the ablation the veins were isolated.  She had some pericarditis after the ablation but this is since resolved.  On April 3 she saw Audry Pili in the A-fib clinic and was doing well.  We had previously discussed left atrial appendage occlusion as a mechanism to avoid long-term exposure anticoagulation given her history of recurrent epistaxis.   Today she tells me she is doing very well.  She has a loose tooth that she is planning to get pulled by dentist in the next couple of weeks.  No problems with her heart rhythm since her ablation.   She tells me that she has intermittent left neck and shoulder pain that is worse with looking the left.  She has an appoint with her primary care physician to address this.   Plan for [employer] today. Procedure reviewed.   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 ECHO 11/06/10 - normal EF, mild MR/TR   Paraesophageal hiatal hernia 2010    lap repair 2010 with biologic mesh   Resting tremor 05/26/2013           Past Surgical History:  Procedure Laterality Date   ATRIAL FIBRILLATION ABLATION N/A 12/15/2021     Procedure: ATRIAL FIBRILLATION ABLATION;  Surgeon: Vickie Epley, MD;  Location: [facility];  Service: Cardiovascular;  Laterality: N/A;   CYSTOSCOPY/RETROGRADE/URETEROSCOPY/STONE EXTRACTION WITH BASKET   2003    Cystoscopy and urethral dilation, right retrograde pyelogram with   LAPAROSCOPIC CHOLECYSTECTOMY W/ CHOLANGIOGRAPHY   2010   LAPAROSCOPIC NISSEN FUNDOPLICATION   9563   LAPAROSCOPIC PARAESOPHAGEAL HERNIA REPAIR   2010    primery with biologic mesh reinforcement   SQUAMOUS CELL CARCINOMA EXCISION   2005      Current Medications: Active Medications      Current Meds  Medication Sig   ALPRAZolam (XANAX) 0.5 MG tablet Take 0.5 mg by mouth at bedtime.   apixaban (ELIQUIS) 5 MG TABS tablet Take 1 tablet (5 mg total) by mouth 2 (two) times daily.   Calcium Carb-Cholecalciferol (CALCIUM 600 + D PO) Take 1 tablet by mouth daily.   diltiazem (CARDIZEM) 60 MG tablet Take 1 tablet (60 mg total) by mouth 4 (four) times daily as needed.   Magnesium 500 MG TABS Take 500 mg by mouth daily.   metoprolol succinate (TOPROL-XL) 50 MG 24 hr tablet Take 50 mg by mouth at bedtime.  Multiple Vitamin (MULTIVITAMIN) tablet Take 1 tablet by mouth daily.   rosuvastatin (CRESTOR) 10 MG tablet Take 1 tablet (10 mg total) by mouth daily.   sodium chloride (OCEAN) 0.65 % SOLN nasal spray Place 1 spray into both nostrils as needed for congestion.   traMADol (ULTRAM) 50 MG tablet Take 1-2 tablets (50-100 mg total) by mouth every 8 (eight) hours as needed.        Allergies:   Propranolol hcl, Shellfish-derived products, and Wellbutrin [bupropion]    Social History         Socioeconomic History   Marital status: Married      Spouse name: Not on file   Number of children: Not on file   Years of education: Not on file   Highest education level: Not on file  Occupational History   Not on file  Tobacco Use   Smoking status: Never   Smokeless tobacco: Never  Substance and Sexual Activity    Alcohol use: Yes      Comment: occ   Drug use: No   Sexual activity: Not on file  Other Topics Concern   Not on file  Social History Narrative   Not on file    Social Determinants of Health    Financial Resource Strain: Not on file  Food Insecurity: Not on file  Transportation Needs: Not on file  Physical Activity: Not on file  Stress: Not on file  Social Connections: Not on file      Family History: The patient's family history includes Cancer in her father.   ROS:   Please see the history of present illness.    All other systems reviewed and are negative.   EKGs/Labs/Other Studies Reviewed:     The following studies were reviewed today:       EKG:  The ekg ordered today demonstrates sinus rhythm.  Ventricular rate 94 bpm.   Recent Labs: 12/01/2021: BUN 11; Creatinine, Ser 0.86; Hemoglobin 12.1; Platelets 245; Potassium 4.0; Sodium 142  Recent Lipid Panel Labs (Brief)          Component Value Date/Time    CHOL 148 03/14/2022 1151    TRIG 127 03/14/2022 1151    HDL 54 03/14/2022 1151    CHOLHDL 2.7 03/14/2022 1151    CHOLHDL 3.7 03/14/2009 0515    VLDL 18 03/14/2009 0515    LDLCALC 72 03/14/2022 1151        Physical Exam:     VS:  BP 120/74   Pulse 90   Ht '5\' 11"'$  (1.803 m)   Wt 195 lb 3.2 oz (88.5 kg)   SpO2 94%   BMI 27.22 kg/m         Wt Readings from Last 3 Encounters:  03/19/22 195 lb 3.2 oz (88.5 kg)  01/03/22 208 lb (94.3 kg)  12/20/21 211 lb 12.8 oz (96.1 kg)      GEN:  Well nourished, well developed in no acute distress HEENT: Normal NECK: No JVD; No carotid bruits LYMPHATICS: No lymphadenopathy CARDIAC: RRR, no murmurs, rubs, gallops RESPIRATORY:  Clear to auscultation without rales, wheezing or rhonchi  ABDOMEN: Soft, non-tender, non-distended MUSCULOSKELETAL:  No edema; No deformity  SKIN: Warm and dry NEUROLOGIC:  Alert and oriented x 3 PSYCHIATRIC:  Normal affect            Assessment ASSESSMENT:     1. PAF (paroxysmal  atrial fibrillation) (HCC)     PLAN:     In order of problems listed above:   #  Paroxysmal AF Doing well after her ablation procedure.  Currently on Eliquis but desires a long-term stroke risk mitigation strategy avoiding blood thinners given her history of recurrent epistaxis.     I have seen [name] in the office today who is being considered for a Watchman left atrial appendage closure device. I believe they will benefit from this procedure given their history of atrial fibrillation, CHA2DS2-VASc score of 3 and unadjusted ischemic stroke rate of 3.2% per year. Unfortunately, the patient is not felt to be a long term anticoagulation candidate secondary to recurrent epistaxis. The patient's chart has been reviewed and I feel that they would be a candidate for short term oral anticoagulation after Watchman implant.    It is my belief that after undergoing a LAA closure procedure, MARGEART ALLENDER will not need long term anticoagulation which eliminates anticoagulation side effects and major bleeding risk.    Procedural risks for the Watchman implant have been reviewed with the patient including a 0.5% risk of stroke, <1% risk of perforation and <1% risk of device embolization. Other risks include bleeding, vascular damage, tamponade, worsening renal function, and death. The patient understands these risk and wishes to proceed.       The published clinical data on the safety and effectiveness of WATCHMAN include but are not limited to the following: - Holmes DR, Mechele Claude, Sick P et al. for the PROTECT AF Investigators. Percutaneous closure of the left atrial appendage versus warfarin therapy for prevention of stroke in patients with atrial fibrillation: a randomised non-inferiority trial. Lancet 2009; 374: 534-42. Mechele Claude, Doshi SK, Abelardo Diesel D et al. on behalf of the PROTECT AF Investigators. Percutaneous Left Atrial Appendage Closure for Stroke Prophylaxis in Patients With  Atrial Fibrillation 2.3-Year Follow-up of the PROTECT AF (Watchman Left Atrial Appendage System for Embolic Protection in Patients With Atrial Fibrillation) Trial. Circulation 2013; 127:720-729. - Alli O, Doshi S,  Kar S, Reddy VY, Sievert H et al. Quality of Life Assessment in the Randomized PROTECT AF (Percutaneous Closure of the Left Atrial Appendage Versus Warfarin Therapy for Prevention of Stroke in Patients With Atrial Fibrillation) Trial of Patients at Risk for Stroke With Nonvalvular Atrial Fibrillation. J Am Coll Cardiol 2013; 46:2703-5. Vertell Limber DR, Tarri Abernethy, Price M, [place], Sievert H, Doshi S, Huber K, Reddy V. Prospective randomized evaluation of the Watchman left atrial appendage Device in patients with atrial fibrillation versus long-term warfarin therapy; the PREVAIL trial. Journal of the [employer] of Cardiology, Vol. 4, No. 1, 2014, 1-11. - Kar S, Doshi SK, Sadhu A, Horton R, Osorio J et al. Primary outcome evaluation of a next-generation left atrial appendage closure device: results from the PINNACLE FLX trial. Circulation 2021;143(18)1754-1762.      After today's visit with the patient which was dedicated solely for shared decision making visit regarding LAA closure device, the patient decided to proceed with the LAA appendage closure procedure scheduled to be done in the near future at [facility].     HAS-BLED score 3 Hypertension Yes  Abnormal renal and liver function (Dialysis, transplant, Cr >2.26 mg/dL /Cirrhosis or Bilirubin >2x Normal or AST/ALT/AP >3x Normal) No  Stroke No  Bleeding Yes  Labile INR (Unstable/high INR) No  Elderly (>65) Yes  Drugs or alcohol (? 8 drinks/week, anti-plt or NSAID) No    CHA2DS2-VASc Score = 3  The patient's score is based upon: CHF History: 0 HTN History: 1 Diabetes History: 0 Stroke History: 0  Vascular Disease History: 0 Age Score: 1 Gender Score: 1        Plan for LAAO today. Procedure reviewed.    Signed, Lars Mage, MD, [facility], [facility] 09/27/2022 Electrophysiology [facility]

## 2022-09-27 NOTE — Anesthesia Postprocedure Evaluation (Signed)
Anesthesia Post Note  Patient: JANYLAH BELGRAVE  Procedure(s) Performed: LEFT ATRIAL APPENDAGE OCCLUSION TRANSESOPHAGEAL ECHOCARDIOGRAM (TEE)     Patient location during evaluation: PACU Anesthesia Type: General Level of consciousness: awake and alert Pain management: pain level controlled Vital Signs Assessment: post-procedure vital signs reviewed and stable Respiratory status: spontaneous breathing, nonlabored ventilation, respiratory function stable and patient connected to nasal cannula oxygen Cardiovascular status: blood pressure returned to baseline and stable Postop Assessment: no apparent nausea or vomiting Anesthetic complications: yes   Encounter Notable Events  Notable Event Outcome Phase Comment  Difficult to intubate - expected  Intraprocedure Filed from anesthesia note documentation.    Last Vitals:  Vitals:   09/27/22 1530 09/27/22 1535  BP: 126/69 107/65  Pulse: 83 87  Resp: 18 16  Temp:    SpO2: 100% 100%    Last Pain:  Vitals:   09/27/22 1504  TempSrc: Temporal  PainSc: 0-No pain                 Belenda Cruise P Anyjah Roundtree

## 2022-09-28 ENCOUNTER — Encounter (HOSPITAL_COMMUNITY): Payer: Self-pay | Admitting: Cardiology

## 2022-09-28 DIAGNOSIS — I480 Paroxysmal atrial fibrillation: Secondary | ICD-10-CM | POA: Diagnosis not present

## 2022-09-28 DIAGNOSIS — I10 Essential (primary) hypertension: Secondary | ICD-10-CM | POA: Diagnosis not present

## 2022-09-28 DIAGNOSIS — F419 Anxiety disorder, unspecified: Secondary | ICD-10-CM | POA: Diagnosis not present

## 2022-09-28 DIAGNOSIS — Z006 Encounter for examination for normal comparison and control in clinical research program: Secondary | ICD-10-CM | POA: Diagnosis not present

## 2022-09-28 LAB — BASIC METABOLIC PANEL
Anion gap: 7 (ref 5–15)
BUN: 15 mg/dL (ref 8–23)
CO2: 23 mmol/L (ref 22–32)
Calcium: 8.6 mg/dL — ABNORMAL LOW (ref 8.9–10.3)
Chloride: 106 mmol/L (ref 98–111)
Creatinine, Ser: 0.95 mg/dL (ref 0.44–1.00)
GFR, Estimated: >60 mL/min (ref >60)
Glucose, Bld: 197 mg/dL — ABNORMAL HIGH (ref 70–99)
Potassium: 3.9 mmol/L (ref 3.5–5.1)
Sodium: 136 mmol/L (ref 135–145)

## 2022-09-28 NOTE — Discharge Summary (Signed)
[facility]    Patient ID: Leslie Taylor,  MRN: [id number], DOB/AGE: [id number] y.o.  Admit date: 09/27/2022 Discharge date: 09/28/2022  Primary Care Physician: Shirline Frees, MD  Primary Cardiologist: Candee Furbish, MD  Electrophysiologist: Vickie Epley, MD  Primary Discharge Diagnosis:  Paroxysmal Atrial Fibrillation Poor candidacy for long term anticoagulation due to  epistaxis  Secondary Discharge Diagnosis:  -AF ablation -Anxiety  Procedures This Admission:  Transeptal Puncture Intra-procedural TEE which showed no LAA thrombus Left atrial appendage occlusive device placement on 09/27/22 by Dr. Quentin Ore.   CONCLUSIONS:  1.Successful implantation of a WATCHMAN left atrial appendage occlusive device    2. TEE demonstrating no LAA thrombus 3. No early apparent complications.    Post Implant Anticoagulation Strategy: Continue Eliquis '5mg'$  PO BID x 45 days after implant. After 45 days, stop Eliquis and start Plavix '75mg'$  PO daily to complete 6 months of post implant therapy. Plan for CT/TEE 60 days after implant to evaluate watchman device position/seal.   Brief HPI: Leslie Taylor is a 67 y.o. female with a history of atrial fibrillation s/p AF ablation with Dr. Quentin Ore 12/15/21, HTN, and anxiety who presented for LAAO closure with Watchman implantation 09/27/22.    Leslie Taylor was seen by Dr. Quentin Ore for atrial fibrillation and underwent successful AF ablation 12/15/21. She followed up with Adline Peals, PA shortly after the procedure and wished to discuss [place] closure with Watchman to avoid long term anticoagulation due to hx of epistaxis.    CT imaging showed anatomy suitable for Watchman implant.   Hospital Course:  The patient was admitted and underwent left atrial appendage occlusive device placement with Watchamn FLX 33m device. She was monitored on telemetry overnight which demonstrated NSR. Groin site has remained stable with no evidence of  bleeding or hematoma. The patient was examined and considered to be stable for discharge. Wound care and restrictions were reviewed with the patient. The patient has been scheduled for post procedure follow up with JKathyrn Drown NP in approximately 1 month. She was restarted on Eliquis and will continue this for x 45 days after implant. After 45 days, stop Eliquis and start Plavix '75mg'$  PO daily to complete 6 months of post implant therapy. Plan for CT/TEE 60 days after implant to evaluate watchman device position/seal. She will require dental SBE for 6 months after implant however she reports that she recently underwent routine cleaning, etc and will likely defer treatment RX for now. This will be further discussed at follow up.   Physical Exam: Vitals:   09/27/22 1800 09/27/22 2012 09/27/22 2329 09/28/22 0411  BP: (!) 108/96 127/72 129/76 118/70  Pulse:  99    Resp: '13 15 20 18  '$ Temp:  97.6 F (36.4 C) 97.8 F (36.6 C) 97.9 F (36.6 C)  TempSrc:  Oral Oral Oral  SpO2:  98% 97% 99%  Weight:      Height:       General: Well developed, well nourished, NAD Lungs:Clear to ausculation bilaterally. No wheezes, rales, or rhonchi. Breathing is unlabored. Cardiovascular: RRR with S1 S2. No murmurs Extremities: No edema. Groin site level 0.  Neuro: Alert and oriented. No focal deficits. No facial asymmetry. MAE spontaneously. Psych: Responds to questions appropriately with normal affect.    Labs:   Lab Results  Component Value Date   WBC 7.0 09/05/2022   HGB 12.4 09/05/2022   HCT 37.6 09/05/2022   MCV 92 09/05/2022   PLT 286 09/05/2022  Recent Labs  Lab 09/28/22 0206  NA 136  K 3.9  CL 106  CO2 23  BUN 15  CREATININE 0.95  CALCIUM 8.6*  GLUCOSE 197*   Discharge Medications:  Allergies as of 09/28/2022       Reactions   Propranolol Hcl Other (See Comments)   Fatigue    Shellfish-derived Products Nausea And Vomiting   Flu like symptoms    Sulfa Antibiotics Other (See  Comments)   Can't tolerate smell   Wellbutrin [bupropion]    Insomnia         Medication List     TAKE these medications    acetaminophen 500 MG tablet Commonly known as: TYLENOL Take 1,000 mg by mouth every 6 (six) hours as needed for headache.   CALCIUM 600 + D PO Take 1 tablet by mouth daily.   diltiazem 60 MG tablet Commonly known as: Cardizem Take 1 tablet (60 mg total) by mouth 4 (four) times daily as needed.   Eliquis 5 MG Tabs tablet Generic drug: apixaban TAKE ONE TABLET BY MOUTH TWICE A DAY   ibuprofen 200 MG tablet Commonly known as: ADVIL Take 200 mg by mouth every 6 (six) hours as needed for headache.   Magnesium 500 MG Tabs Take 500 mg by mouth daily.   metoprolol succinate 50 MG 24 hr tablet Commonly known as: TOPROL-XL Take 50 mg by mouth at bedtime.   multivitamin tablet Take 1 tablet by mouth daily. One a day   rosuvastatin 10 MG tablet Commonly known as: CRESTOR Take 1 tablet (10 mg total) by mouth daily.   sodium chloride 0.65 % Soln nasal spray Commonly known as: OCEAN Place 1 spray into both nostrils as needed for congestion.   traMADol 50 MG tablet Commonly known as: ULTRAM Take 1-2 tablets (50-100 mg total) by mouth every 8 (eight) hours as needed.   Vitamin D3 50 MCG (2000 UT) capsule Take 2,000 Units by mouth daily.   zaleplon 5 MG capsule Commonly known as: SONATA Take 5 mg by mouth at bedtime.       Disposition:  Home  Discharge Instructions     Call MD for:  difficulty breathing, headache or visual disturbances   Complete by: As directed    Call MD for:  extreme fatigue   Complete by: As directed    Call MD for:  hives   Complete by: As directed    Call MD for:  persistant dizziness or light-headedness   Complete by: As directed    Call MD for:  persistant nausea and vomiting   Complete by: As directed    Call MD for:  redness, tenderness, or signs of infection (pain, swelling, redness, odor or green/yellow  discharge around incision site)   Complete by: As directed    Call MD for:  severe uncontrolled pain   Complete by: As directed    Call MD for:  temperature >100.4   Complete by: As directed    Diet - low sodium heart healthy   Complete by: As directed    Discharge instructions   Complete by: As directed    [facility] Procedure, Care After  Procedure MD: Dr. Benson Norway Clinical Coordinator: Lenice Llamas, RN  This sheet gives you information about how to care for yourself after your procedure. Your health care provider may also give you more specific instructions. If you have problems or questions, contact your health care provider.  What can I expect after the procedure? After the  procedure, it is common to have: Bruising around your puncture site. Tenderness around your puncture site. Tiredness (fatigue).  Medication instructions It is very important to continue to take your blood thinner as directed by your doctor after the Watchman procedure. Call your procedure doctor's office with question or concerns. If you are on Coumadin (warfarin), you will have your INR checked the week after your procedure, with a goal INR of 2.0 - 3.0. Please follow your medication instructions on your discharge summary. Only take the medications listed on your discharge paperwork.  Follow up You will be seen in 1 month after your procedure You will have a repeat CT scan approximately 8 weeks after your procedure mark to check your device You will follow up the MD/APP who performed your procedure 6 months after your procedure The Watchman Clinical Coordinator will check in with you from time to time, including 1 and 2 years after your procedure.    Follow these instructions at home: Puncture site care  Follow instructions from your health care provider about how to take care of your puncture site. Make sure you: If present, leave stitches (sutures), skin glue, or adhesive strips in place.  If a  large square bandage is present, this may be removed 24 hours after surgery.  Check your puncture site every day for signs of infection. Check for: Redness, swelling, or pain. Fluid or blood. If your puncture site starts to bleed, lie down on your back, apply firm pressure to the area, and contact your health care provider. Warmth. Pus or a bad smell. Driving Do not drive yourself home if you received sedation Do not drive for at least 4 days after your procedure or however long your health care provider recommends. (Do not resume driving if you have previously been instructed not to drive for other health reasons.) Do not spend greater than 1 hour at a time in a car for the first 3 days. Stop and take a break with a 5 minute walk at least every hour.  Do not drive or use heavy machinery while taking prescription pain medicine.  Activity Avoid activities that take a lot of effort, including exercise, for at least 7 days after your procedure. For the first 3 days, avoid sitting for longer than one hour at a time.  Avoid alcoholic beverages, signing paperwork, or participating in legal proceedings for 24 hours after receiving sedation Do not lift anything that is heavier than 10 lb (4.5 kg) for one week.  No sexual activity for 1 week.  Return to your normal activities as told by your health care provider. Ask your health care provider what activities are safe for you. General instructions Take over-the-counter and prescription medicines only as told by your health care provider. Do not use any products that contain nicotine or tobacco, such as cigarettes and e-cigarettes. If you need help quitting, ask your health care provider. You may shower after 24 hours, but Do not take baths, swim, or use a hot tub for 1 week.  Do not drink alcohol for 24 hours after your procedure. Keep all follow-up visits as told by your health care provider. This is important. Dental Work: You will require  antibiotics prior to any dental work, including cleanings, for 6 months after your Watchman implantation to help protect you from infection. After 6 months, antibiotics are no longer required. Contact a health care provider if: You have redness, mild swelling, or pain around your puncture site. You have soreness in  your throat or at your puncture site that does not improve after several days You have fluid or blood coming from your puncture site that stops after applying firm pressure to the area. Your puncture site feels warm to the touch. You have pus or a bad smell coming from your puncture site. You have a fever. You have chest pain or discomfort that spreads to your neck, jaw, or arm. You are sweating a lot. You feel nauseous. You have a fast or irregular heartbeat. You have shortness of breath. You are dizzy or light-headed and feel the need to lie down. You have pain or numbness in the arm or leg closest to your puncture site. Get help right away if: Your puncture site suddenly swells. Your puncture site is bleeding and the bleeding does not stop after applying firm pressure to the area. These symptoms may represent a serious problem that is an emergency. Do not wait to see if the symptoms will go away. Get medical help right away. Call your local emergency services (911 in the [place] Do not drive yourself to the hospital. Summary After the procedure, it is normal to have bruising and tenderness at the puncture site in your groin, neck, or forearm. Check your puncture site every day for signs of infection. Get help right away if your puncture site is bleeding and the bleeding does not stop after applying firm pressure to the area. This is a medical emergency.  This information is not intended to replace advice given to you by your health care provider. Make sure you discuss any questions you have with your health care provider.   Increase activity slowly   Complete by: As directed         Duration of Discharge Encounter: Greater than 30 minutes including physician time.  Signed, Kathyrn Drown, NP  09/28/2022 8:28 AM

## 2022-09-28 NOTE — Progress Notes (Addendum)
Discharge instructions reviewed with pt and her husband. Reviewed written d/c instructions on care post watchman placement. Pt had no further questions.  Copy of instructions given to pt. No new scripts.  Pt to be d/c'd via wheelchair with belongings, with husband.           To be escorted by hospital volunteer.

## 2022-10-01 ENCOUNTER — Telehealth: Payer: Self-pay

## 2022-10-01 NOTE — Telephone Encounter (Signed)
  [facility]   Watchman Team  Contacted the patient regarding discharge from [facility] on 09/28/2022.  The patient understands to follow up with [facility] on 11/07/2022 in preparation for imaging on 11/29/2022.  The patient understands discharge instructions? Yes  The patient understands medications and regimen? Yes   The patient reports groin site looks good, no issues.   The patient understands to call with any questions or concerns prior to scheduled visit.

## 2022-10-02 ENCOUNTER — Encounter (HOSPITAL_COMMUNITY): Payer: Self-pay | Admitting: Cardiology

## 2022-10-12 ENCOUNTER — Telehealth: Payer: Self-pay | Admitting: Cardiology

## 2022-10-12 NOTE — Telephone Encounter (Signed)
Pt c/o swelling: STAT is pt has developed SOB within 24 hours  If swelling, where is the swelling located? Both legs  How much weight have you gained and in what time span? Unsure  Have you gained 3 pounds in a day or 5 pounds in a week? Has not weighed  Do you have a log of your daily weights (if so, list)? No   Are you currently taking a fluid pill? No   Are you currently SOB? No   Have you traveled recently? No    Started Tuesday night in both legs, but is now worse in right leg. She reports they were so tight they were shiny and an indention is left when she presses her finger on the legs. Is wanting to know what to do besides elevation.

## 2022-10-12 NOTE — Telephone Encounter (Signed)
Patient reports new onset of lower extremity edema this week. She states the right leg is worse than the left and is red, warm to touch. She states yesterday her left leg was red and warm but has improved today. She is not on a diuretic. She has been keeping her legs elevated. I advised I would forward to Dr Mardene Speak nurse to review since she recently had a Watchman procedure. Patient was agreeable to this plan.

## 2022-10-16 ENCOUNTER — Telehealth: Payer: Self-pay | Admitting: Cardiology

## 2022-10-16 NOTE — Telephone Encounter (Signed)
Called to inform the patient that I have have scheduled her to see me Friday 10/19/22 at 9am. LVM for her to call back.   Kathyrn Drown NP-C [facility] Team  Phone: [phone]

## 2022-10-16 NOTE — Progress Notes (Signed)
[facility]                                     Cardiology Office Note:    Date:  10/19/2022   ID:  [name], DOB [birth year], MRN [id number]  PCP:  Shirline Frees, MD  [facility] Cardiologist:  Candee Furbish, MD  [facility] Electrophysiologist:  Vickie Epley, MD   Referring MD: Shirline Frees, MD   Chief Complaint  Patient presents with   Follow-up    Follow up LE edema/fatigue    History of Present Illness:    Leslie Taylor is a 67 y.o. female with a hx of atrial fibrillation s/p AF ablation with Dr. Quentin Ore 12/15/21, HTN, and anxiety who underwent LAAO closure with Watchman implantation 09/27/22 and is being seen today for recent LE edema and fatigue.    [name] was seen by Dr. Quentin Ore for atrial fibrillation and underwent successful AF ablation 12/15/21. She followed up with Adline Peals, PA shortly after the procedure and wished to discuss [place] closure with Watchman to avoid long term anticoagulation due to hx of epistaxis.    CT imaging showed anatomy suitable for Watchman implant. She is now s/p LAAO closure with Watchamn FLX 19m device. She was restarted on Eliquis with plans to continue this for x 45 days after implant. After 45 days, stop Eliquis and start Plavix 735mPO daily to complete 6 months of post implant therapy. Plan for CT/TEE 60 days after implant to evaluate watchman device position/seal.   She recently called the office 2/9 with c/o new onset of lower extremity edema and profound fatigue. In follow up today, she reports that in the several days following her procedure she was feeling great with no issues. She became sick with an upper respiratory infection and began not feeling well. She really was not doing much and really staying in the bed. She then developed bilateral pitting LE edema. This has since subsided without intervention. On exam today, she is rather anxious about her symptoms. She was found to be tachycardic with  rates in the low 100's. Out of concern for recurrent AF, EKG performed that showed ST. She denies chest pain, SOB, orthopnea, dizziness, or syncope. She has some fatigue as stated above. No fevers, N/V, or other signs of infection.    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 ECHO 11/06/10 - normal EF, mild MR/TR   Paraesophageal hiatal hernia 2010   lap repair 2010 with biologic mesh   PONV (postoperative nausea and vomiting)    Presence of Watchman left atrial appendage closure device 09/27/2022   Watchman FLX 3536mlaced by Dr. LamQuentin OreResting tremor 05/26/2013    Past Surgical History:  Procedure Laterality Date   ATRIAL FIBRILLATION ABLATION N/A 12/15/2021   Procedure: ATRIAL FIBRILLATION ABLATION;  Surgeon: LamVickie EpleyD;  Location: [facility];  Service: Cardiovascular;  Laterality: N/A;   CYSTOSCOPY/RETROGRADE/URETEROSCOPY/STONE EXTRACTION WITH BASKET  2003   Cystoscopy and urethral dilation, right retrograde pyelogram with   LAPAROSCOPIC CHOLECYSTECTOMY W/ CHOLANGIOGRAPHY  2010   LAPAROSCOPIC NISSEN FUNDOPLICATION  [id number]   LAPAROSCOPIC PARAESOPHAGEAL HERNIA REPAIR  2010   primery with biologic mesh reinforcement   LEFT ATRIAL APPENDAGE OCCLUSION N/A 09/27/2022   Procedure: LEFT ATRIAL APPENDAGE OCCLUSION;  Surgeon: Vickie Epley, MD;  Location: [facility];  Service: Cardiovascular;  Laterality: N/A;   SQUAMOUS CELL CARCINOMA EXCISION  2005   TEE WITHOUT CARDIOVERSION N/A 09/27/2022   Procedure: TRANSESOPHAGEAL ECHOCARDIOGRAM (TEE);   Surgeon: Vickie Epley, MD;  Location: [facility] [facility];  Service: Cardiovascular;  Laterality: N/A;    Current Medications: Current Meds  Medication Sig   acetaminophen (TYLENOL) 500 MG tablet Take 1,000 mg by mouth every 6 (six) hours as needed for headache.   Calcium Carb-Cholecalciferol (CALCIUM 600 + D PO) Take 1 tablet by mouth daily.   Cholecalciferol (VITAMIN D3) 50 MCG (2000 UT) capsule Take 2,000 Units by mouth daily.   diltiazem (CARDIZEM) 60 MG tablet Take 1 tablet (60 mg total) by mouth 4 (four) times daily as needed.   ELIQUIS 5 MG TABS tablet TAKE ONE TABLET BY MOUTH TWICE A DAY   furosemide (LASIX) 20 MG tablet Take 1 tablet (20 mg total) by mouth as needed for fluid or edema.   ibuprofen (ADVIL) 200 MG tablet Take 200 mg by mouth every 6 (six) hours as needed for headache.   Magnesium 500 MG TABS Take 500 mg by mouth daily.   metoprolol succinate (TOPROL XL) 50 MG 24 hr tablet Take 1.5 tablets (75 mg total) by mouth daily. Take with or immediately following a meal.   Multiple Vitamin (MULTIVITAMIN) tablet Take 1 tablet by mouth daily. One a day   rosuvastatin (CRESTOR) 10 MG tablet Take 1 tablet (10 mg total) by mouth daily.   sodium chloride (OCEAN) 0.65 % SOLN nasal spray Place 1 spray into both nostrils as needed for congestion.   traMADol (ULTRAM) 50 MG tablet Take 1-2 tablets (50-100 mg total) by mouth every 8 (eight) hours as needed.   zaleplon (SONATA) 5 MG capsule Take 5 mg by mouth at bedtime.   [DISCONTINUED] metoprolol succinate (TOPROL-XL) 50 MG 24 hr tablet Take 50 mg by mouth at bedtime.     Allergies:   Propranolol hcl, Shellfish-derived products, Sulfa antibiotics, and Wellbutrin [bupropion]   Social History   Socioeconomic History   Marital status: Married    Spouse name: Not on file   Number of children: Not on file   Years of education: Not on file   Highest education level: Not on file  Occupational History   Not on file  Tobacco Use    Smoking status: Never   Smokeless tobacco: Never  Vaping Use   Vaping Use: Never used  Substance and Sexual Activity   Alcohol use: Not Currently    Comment: occ   Drug use: No   Sexual activity: Not on file  Other Topics Concern   Not on file  Social History Narrative   Not on file   Social Determinants of Health   Financial Resource Strain: Not on file  Food Insecurity: Not on file  Transportation Needs: Not on file  Physical Activity: Not on file  Stress: Not on file  Social Connections: Not on file    Family History: The patient's family history includes Cancer in  her father.  ROS:   Please see the history of present illness.    All other systems reviewed and are negative.  EKGs/Labs/Other Studies Reviewed:    The following studies were reviewed today:  LAAO 09/27/22:  Primary Discharge Diagnosis:  Paroxysmal Atrial Fibrillation Poor candidacy for long term anticoagulation due to  epistaxis   Secondary Discharge Diagnosis:  -AF ablation -Anxiety   Procedures This Admission:  Transeptal Puncture Intra-procedural TEE which showed no LAA thrombus Left atrial appendage occlusive device placement on 09/27/22 by Dr. Quentin Ore.    CONCLUSIONS:  1.Successful implantation of a WATCHMAN left atrial appendage occlusive device    2. TEE demonstrating no LAA thrombus 3. No early apparent complications.    Post Implant Anticoagulation Strategy: Continue Eliquis 3m PO BID x 45 days after implant. After 45 days, stop Eliquis and start Plavix 749mPO daily to complete 6 months of post implant therapy. Plan for CT/TEE 60 days after implant to evaluate watchman device position/seal.  EKG:  EKG is ordered today.  The ekg ordered today demonstrates sinus tachycardia with HR 102bpm  Recent Labs: 09/05/2022: Hemoglobin 12.4; Platelets 286 09/28/2022: BUN 15; Creatinine, Ser 0.95; Potassium 3.9; Sodium 136   Recent Lipid Panel    Component Value Date/Time   CHOL 148 03/14/2022  1151   TRIG 127 03/14/2022 1151   HDL 54 03/14/2022 1151   CHOLHDL 2.7 03/14/2022 1151   CHOLHDL 3.7 03/14/2009 0515   VLDL 18 03/14/2009 0515   LDLCALC 72 03/14/2022 1151   Physical Exam:    VS:  BP 110/64   Pulse (!) 52   Ht 5' 11.5" (1.816 m)   Wt 199 lb 9.6 oz (90.5 kg)   SpO2 99%   BMI 27.45 kg/m     Wt Readings from Last 3 Encounters:  10/19/22 199 lb 9.6 oz (90.5 kg)  09/27/22 200 lb (90.7 kg)  09/05/22 202 lb 3.2 oz (91.7 kg)    General: Well developed, well nourished, NAD Lungs:Clear to ausculation bilaterally. No wheezes, rales, or rhonchi. Breathing is unlabored. Cardiovascular: RRR with S1 S2. No murmurs Extremities: No edema. Neuro: Alert and oriented. No focal deficits. No facial asymmetry. MAE spontaneously. Psych: Responds to questions appropriately with normal affect.    ASSESSMENT/PLAN:    PAF/tachycardia: Referred to Dr. LaQuentin Oreor AF ablation performed 12/2021 with LAAO closure to follow. She is now s/p Watchman FLX 3556mevice. She was restarted on Eliquis with plans to continue for 45 days. After 45 days, stop Eliquis and start Plavix 49m55m daily to complete 6 months of post implant therapy. Plan for CT/TEE 60 days after implant. Having more fatigue since procedure with EKG today showing tachycardia with a rate at 102bpm. Out of concern for recurrent atrial fibrillation, ZIO AT ordered but [employer] will not cover therefore this was converted to ZIO XT. Toprol increased to 49mg65mdue to softer BP. Plan to follow once monitor results. She is already scheduled to see me back 3/6. If monitor has resulted I will keep her telephone visit on 2/26. PRN Lasix given if edema recurs.    Medication Adjustments/Labs and Tests Ordered: Current medicines are reviewed at length with the patient today.  Concerns regarding medicines are outlined above.  Orders Placed This Encounter  Procedures   LONG TERM MONITOR (3-14 DAYS)   Meds ordered this encounter  Medications    metoprolol succinate (TOPROL XL) 50 MG 24 hr tablet    Sig: Take 1.5 tablets (75 mg total) by  mouth daily. Take with or immediately following a meal.    Dispense:  45 tablet    Refill:  1   furosemide (LASIX) 20 MG tablet    Sig: Take 1 tablet (20 mg total) by mouth as needed for fluid or edema.    Dispense:  90 tablet    Refill:  3    Patient Instructions  Medication Instructions:  Your physician has recommended you make the following change in your medication:   INCREASE TOPROL TO 75 MG DAILY. START LASIX 20 MG AS NEEDED FOR SWELLING  *If you need a refill on your cardiac medications before your next appointment, please call your pharmacy*   Lab Work: NONE If you have labs (blood work) drawn today and your tests are completely normal, you will receive your results only by: [place] (if you have MyChart) OR A paper copy in the mail If you have any lab test that is abnormal or we need to change your treatment, we will call you to review the results.   Testing/Procedures: Bryn Gulling- Long Term Monitor Instructions  Your physician has requested you wear a ZIO patch monitor for 7 days.  This is a single patch monitor. [employer] supplies one patch monitor per enrollment. Additional stickers are not available. Please do not apply patch if you will be having a Nuclear Stress Test,  Echocardiogram, Cardiac CT, MRI, or Chest Xray during the period you would be wearing the  monitor. The patch cannot be worn during these tests. You cannot remove and re-apply the  ZIO XT patch monitor.  Your ZIO patch monitor will be mailed 3 day USPS to your address on file. It may take 3-5 days  to receive your monitor after you have been enrolled.  Once you have received your monitor, please review the enclosed instructions. Your monitor  has already been registered assigning a specific monitor serial # to you.  Billing and Patient Assistance Program Information  We have supplied [employer] with any  of your insurance information on file for billing purposes. [employer] offers a sliding scale Patient Assistance Program for patients that do not have  insurance, or whose insurance does not completely cover the cost of the ZIO monitor.  You must apply for the Patient Assistance Program to qualify for this discounted rate.  To apply, please call [employer] at [phone], select option 4, select option 2, ask to apply for  Patient Assistance Program. Theodore Demark will ask your household income, and how many people  are in your household. They will quote your out-of-pocket cost based on that information.  [employer] will also be able to set up a 36-month interest-free payment plan if needed.  Follow-Up: [facility].  As part of our continuing mission to provide you with exceptional heart care, we have created designated Provider Care Teams.  These Care Teams include your primary Cardiologist (physician) and Advanced Practice Providers (APPs -  Physician Assistants and Nurse Practitioners) who all work together to provide you with the care you need, when you need it.  We recommend signing up for the patient portal called "MyChart".  Sign up information is provided on this After Visit Summary.  MyChart is used to connect with patients for Virtual Visits (Telemedicine).  Patients are able to view lab/test results, encounter notes, upcoming appointments, etc.  Non-urgent messages can be sent to your provider as well.   To learn more about what you can do  with MyChart, go to [email or web address].    Your next appointment:   2-3 week(s)  Provider:   Kathyrn Drown, NP    Signed, Kathyrn Drown, NP  10/19/2022 1:52 PM    [place]

## 2022-10-16 NOTE — Telephone Encounter (Signed)
Patient is scheduled for 2/16 with Kathyrn Drown, NP. Left message for patient with this information. Advised to call back to confirm that she can make it.

## 2022-10-18 ENCOUNTER — Telehealth: Payer: Self-pay | Admitting: Cardiology

## 2022-10-18 NOTE — Telephone Encounter (Signed)
Pt returning NP's call from 10/16/22. Please advise  FYI pt states that swelling has gone down

## 2022-10-18 NOTE — Telephone Encounter (Signed)
Called to inform the patient that I have have scheduled her to see me Friday 10/19/22 at 9am. LVM for her to call back.    Kathyrn Drown NP-C Structural Heart Team  Phone: [phone]     Pt advised.

## 2022-10-19 ENCOUNTER — Ambulatory Visit: Payer: Medicare Other | Attending: Cardiology | Admitting: Cardiology

## 2022-10-19 ENCOUNTER — Ambulatory Visit (INDEPENDENT_AMBULATORY_CARE_PROVIDER_SITE_OTHER): Payer: Medicare Other

## 2022-10-19 VITALS — BP 110/64 | HR 52 | Ht 71.5 in | Wt 199.6 lb

## 2022-10-19 DIAGNOSIS — I480 Paroxysmal atrial fibrillation: Secondary | ICD-10-CM

## 2022-10-19 DIAGNOSIS — I10 Essential (primary) hypertension: Secondary | ICD-10-CM

## 2022-10-19 DIAGNOSIS — Z95818 Presence of other cardiac implants and grafts: Secondary | ICD-10-CM | POA: Diagnosis not present

## 2022-10-19 MED ORDER — FUROSEMIDE 20 MG PO TABS: 20.0000 mg | ORAL_TABLET | ORAL | 3 refills | Status: AC | PRN

## 2022-10-19 MED ORDER — METOPROLOL SUCCINATE ER 50 MG PO TB24: 75.0000 mg | ORAL_TABLET | Freq: Every day | ORAL | 1 refills | Status: DC

## 2022-10-19 NOTE — Patient Instructions (Addendum)
Medication Instructions:  Your physician has recommended you make the following change in your medication:   INCREASE TOPROL TO 75 MG DAILY. START LASIX 20 MG AS NEEDED FOR SWELLING  *If you need a refill on your cardiac medications before your next appointment, please call your pharmacy*   Lab Work: NONE If you have labs (blood work) drawn today and your tests are completely normal, you will receive your results only by: [place] (if you have MyChart) OR A paper copy in the mail If you have any lab test that is abnormal or we need to change your treatment, we will call you to review the results.   Testing/Procedures: Bryn Gulling- Long Term Monitor Instructions  Your physician has requested you wear a ZIO patch monitor for 7 days.  This is a single patch monitor. [employer] supplies one patch monitor per enrollment. Additional stickers are not available. Please do not apply patch if you will be having a Nuclear Stress Test,  Echocardiogram, Cardiac CT, MRI, or Chest Xray during the period you would be wearing the  monitor. The patch cannot be worn during these tests. You cannot remove and re-apply the  ZIO XT patch monitor.  Your ZIO patch monitor will be mailed 3 day USPS to your address on file. It may take 3-5 days  to receive your monitor after you have been enrolled.  Once you have received your monitor, please review the enclosed instructions. Your monitor  has already been registered assigning a specific monitor serial # to you.  Billing and Patient Assistance Program Information  We have supplied [employer] with any of your insurance information on file for billing purposes. [employer] offers a sliding scale Patient Assistance Program for patients that do not have  insurance, or whose insurance does not completely cover the cost of the ZIO monitor.  You must apply for the Patient Assistance Program to qualify for this discounted rate.  To apply, please call [employer] at  [phone], select option 4, select option 2, ask to apply for  Patient Assistance Program. Theodore Demark will ask your household income, and how many people  are in your household. They will quote your out-of-pocket cost based on that information.  [employer] will also be able to set up a 57-month interest-free payment plan if needed.  Follow-Up: [facility].  As part of our continuing mission to provide you with exceptional heart care, we have created designated Provider Care Teams.  These Care Teams include your primary Cardiologist (physician) and Advanced Practice Providers (APPs -  Physician Assistants and Nurse Practitioners) who all work together to provide you with the care you need, when you need it.  We recommend signing up for the patient portal called "MyChart".  Sign up information is provided on this After Visit Summary.  MyChart is used to connect with patients for Virtual Visits (Telemedicine).  Patients are able to view lab/test results, encounter notes, upcoming appointments, etc.  Non-urgent messages can be sent to your provider as well.   To learn more about what you can do with MyChart, go to [email or web address]    Your next appointment:   2-3 week(s)  Provider:   JKathyrn Drown NP

## 2022-10-19 NOTE — Progress Notes (Unsigned)
Applied a 7 day Zio XT monitor to patient in the office  Dr Quentin Ore to read

## 2022-10-22 NOTE — Addendum Note (Signed)
Addended by: Jacinta Shoe on: 10/22/2022 04:09 PM   Modules accepted: Orders

## 2022-10-29 ENCOUNTER — Telehealth: Payer: Medicare Other

## 2022-11-06 DIAGNOSIS — I480 Paroxysmal atrial fibrillation: Secondary | ICD-10-CM | POA: Diagnosis not present

## 2022-11-06 NOTE — Progress Notes (Deleted)
[facility]                                     Cardiology Office Note:    Date:  11/06/2022   ID:  Leslie Taylor, DOB [birth year], MRN [id number]  PCP:  Johny Blamer, MD  [facility] Cardiologist:  Donato Schultz, MD  [facility] Electrophysiologist:  Lanier Prude, MD   Referring MD: Johny Blamer, MD   No chief complaint on file. ***  History of Present Illness:    Leslie Taylor is a 67 y.o. female with a hx of atrial fibrillation s/p AF ablation with Dr. Lalla Brothers 12/15/21, HTN, and anxiety who underwent LAAO closure with Watchman implantation 09/27/22 and is being seen today for recent LE edema and fatigue.    Leslie Taylor was seen by Dr. Lalla Brothers for atrial fibrillation and underwent successful AF ablation 12/15/21. She followed up with Jorja Loa, PA shortly after the procedure and wished to discuss LAAO closure with Watchman to avoid long term anticoagulation due to hx of epistaxis.    CT imaging showed anatomy suitable for Watchman implant. She is now s/p LAAO closure with Watchamn FLX 25mm device. She was restarted on Eliquis with plans to continue this for x 45 days after implant. After 45 days, stop Eliquis and start Plavix 75mg  PO daily to complete 6 months of post implant therapy. Plan for CT/TEE 60 days after implant to evaluate watchman device position/seal.    She recently called the office 2/9 with c/o new onset of lower extremity edema and profound fatigue. In follow up today, she reports that in the several days following her procedure she was feeling great with no issues. She became sick with an upper respiratory infection and began not feeling well. She really was not doing much and really staying in the bed. She then developed bilateral pitting LE edema. This has since subsided without intervention. On exam today, she is rather anxious about her symptoms. She was found to be tachycardic with rates in the low 100's. Out of concern for recurrent  AF, EKG performed that showed ST. She denies chest pain, SOB, orthopnea, dizziness, or syncope. She has some fatigue as stated above. No fevers, N/V, or other signs of infection.     PAF/tachycardia: Referred to Dr. 4/9 for AF ablation performed 12/2021 with LAAO closure to follow. She is now s/p Watchman FLX 61mm device. She was restarted on Eliquis with plans to continue for 45 days. After 45 days, stop Eliquis and start Plavix 75mg  PO daily to complete 6 months of post implant therapy. Plan for CT/TEE 60 days after implant. Having more fatigue since procedure with EKG today showing tachycardia with a rate at 102bpm. Out of concern for recurrent atrial fibrillation, ZIO AT ordered but [employer] will not cover therefore this was converted to ZIO XT. Toprol increased to 75mg  QD due to softer BP. Plan to follow once monitor results. She is already scheduled to see me back 3/6. If monitor has resulted I will keep her telephone visit on 2/26. PRN Lasix given if edema recurs.     Monitor results:   Patch Wear Time: 10 days and 11 hours (2024-02-16T10:15:46-0500 to 2024-02-26T21:29:06-499)   Patient had a min HR of 59 bpm, max HR of 142 bpm, and avg HR of 93 bpm. Predominant underlying rhythm was Sinus Rhythm. Isolated SVEs were rare (<1.0%), SVE Couplets  were rare (<1.0%), and no SVE Triplets were present. Isolated VEs were rare (<1.0%),  and no VE Couplets or VE Triplets were present.    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 ECHO 11/06/10 - normal EF, mild MR/TR   Paraesophageal  hiatal hernia 2010   lap repair 2010 with biologic mesh   PONV (postoperative nausea and vomiting)    Presence of Watchman left atrial appendage closure device 09/27/2022   Watchman FLX 34mm placed by Dr. Lalla Brothers   Resting tremor 05/26/2013    Past Surgical History:  Procedure Laterality Date   ATRIAL FIBRILLATION ABLATION N/A 12/15/2021   Procedure: ATRIAL FIBRILLATION ABLATION;  Surgeon: Lanier Prude, MD;  Location: [facility] INVASIVE CV LAB;  Service: Cardiovascular;  Laterality: N/A;   CYSTOSCOPY/RETROGRADE/URETEROSCOPY/STONE EXTRACTION WITH BASKET  2003   Cystoscopy and urethral dilation, right retrograde pyelogram with   LAPAROSCOPIC CHOLECYSTECTOMY W/ CHOLANGIOGRAPHY  2010   LAPAROSCOPIC NISSEN FUNDOPLICATION  2010   LAPAROSCOPIC PARAESOPHAGEAL HERNIA REPAIR  2010   primery with biologic mesh reinforcement   LEFT ATRIAL APPENDAGE OCCLUSION N/A 09/27/2022   Procedure: LEFT ATRIAL APPENDAGE OCCLUSION;  Surgeon: Lanier Prude, MD;  Location: [facility] INVASIVE CV LAB;  Service: Cardiovascular;  Laterality: N/A;   SQUAMOUS CELL CARCINOMA EXCISION  2005   TEE WITHOUT CARDIOVERSION N/A 09/27/2022   Procedure: TRANSESOPHAGEAL ECHOCARDIOGRAM (TEE);  Surgeon: Lanier Prude, MD;  Location: [facility] INVASIVE CV LAB;  Service: Cardiovascular;  Laterality: N/A;    Current Medications: No outpatient medications have been marked as taking for the 11/07/22 encounter (Appointment) with [facility].     Allergies:   Propranolol hcl, Shellfish-derived products, Sulfa antibiotics, and Wellbutrin [bupropion]   Social History   Socioeconomic History   Marital status: Married    Spouse name: Not on file   Number of children: Not on file   Years of education: Not on file   Highest education level: Not on file  Occupational History   Not on file  Tobacco Use   Smoking status: Never   Smokeless tobacco: Never  Vaping Use   Vaping Use: Never used  Substance and Sexual Activity    Alcohol use: Not Currently    Comment: occ   Drug use: No   Sexual activity: Not on file  Other Topics Concern   Not on file  Social History Narrative   Not on file   Social Determinants of Health   Financial Resource Strain: Not on file  Food Insecurity: Not on file  Transportation Needs: Not on file  Physical Activity: Not on file  Stress: Not on file  Social Connections: Not on file     Family History: The patient's ***family history includes Cancer in her father.  ROS:   Please see the history of present illness.    All other systems reviewed and are negative.  EKGs/Labs/Other Studies Reviewed:    The following studies were reviewed today: ***  EKG:  EKG is *** ordered today.  The ekg ordered today demonstrates ***  Recent Labs: 09/05/2022: Hemoglobin 12.4; Platelets 286 09/28/2022: BUN 15; Creatinine, Ser 0.95; Potassium 3.9; Sodium 136  Recent Lipid Panel    Component Value Date/Time   CHOL 148 03/14/2022 1151   TRIG 127 03/14/2022 1151   HDL 54 03/14/2022 1151   CHOLHDL 2.7 03/14/2022 1151   CHOLHDL 3.7 03/14/2009 0515   VLDL 18 03/14/2009 0515   LDLCALC 72 03/14/2022 1151     Risk Assessment/Calculations:   {Does this patient have ATRIAL FIBRILLATION?[phone]}   CHA2DS2-VASc Score = 4 [CHF History: 0, HTN History: 1, Diabetes History: 0, Stroke History: 0, Vascular Disease History: 1, Age Score: 1, Gender Score: 1].  Therefore, the patient's annual risk of stroke is 4.8 %.        HAS-BLED score *** Hypertension (Uncontrolled in 30 days)  {YES/NO:21197} Abnormal renal and liver function (Dialysis, transplant, Cr >2.26 mg/dL /Cirrhosis or Bilirubin >2x Normal or AST/ALT/AP >3x Normal) {YES/NO:21197} Stroke {YES/NO:21197} Bleeding {YES/NO:21197} Labile INR (Unstable/high INR) {YES/NO:21197} Elderly (>65) {YES/NO:21197} Drugs or alcohol (? 8 drinks/week, anti-plt or NSAID) {YES/NO:21197}   Physical Exam:    VS:  There were no vitals taken for  this visit.    Wt Readings from Last 3 Encounters:  10/19/22 199 lb 9.6 oz (90.5 kg)  09/27/22 200 lb (90.7 kg)  09/05/22 202 lb 3.2 oz (91.7 kg)     GEN: *** Well nourished, well developed in no acute distress HEENT: Normal NECK: No JVD; No carotid bruits LYMPHATICS: No lymphadenopathy CARDIAC: ***RRR, no murmurs, rubs, gallops RESPIRATORY:  Clear to auscultation without rales, wheezing or rhonchi  ABDOMEN: Soft, non-tender, non-distended MUSCULOSKELETAL:  No edema; No deformity  SKIN: Warm and dry NEUROLOGIC:  Alert and oriented x 3 PSYCHIATRIC:  Normal affect   ASSESSMENT:    No diagnosis found. PLAN:    In order of problems listed above:       {Are you ordering a CV Procedure (e.g. stress test, cath, DCCV, TEE, etc)?   Press F2        :[id number]}    Medication Adjustments/Labs and Tests Ordered: Current medicines are reviewed at length with the patient today.  Concerns regarding medicines are outlined above.  No orders of the defined types were placed in this encounter.  No orders of the defined types were placed in this encounter.   There are no Patient Instructions on file for this visit.   Signed, Georgie Chard, NP  11/06/2022 3:22 PM    [facility]

## 2022-11-07 ENCOUNTER — Telehealth: Payer: Self-pay | Admitting: Cardiology

## 2022-11-07 ENCOUNTER — Ambulatory Visit: Payer: Medicare Other

## 2022-11-07 ENCOUNTER — Other Ambulatory Visit: Payer: Self-pay | Admitting: Cardiology

## 2022-11-07 MED ORDER — CLOPIDOGREL BISULFATE 75 MG PO TABS: 75.0000 mg | ORAL_TABLET | Freq: Every day | ORAL | 2 refills | Status: DC

## 2022-11-07 NOTE — Telephone Encounter (Signed)
  [facility] [place]   Ms. Lorig was scheduled for an office visit today to discuss post Watchman medication instructions and obtain pre CT labs (BMET). Unfortunately, she received a call about her ill sister and needed to cancel the appointment today. I will call her tomorrow afternoon to review the the med changes as below.   She will continue her Eliquis through Sunday 11/11/22 then stop and transition to Plavix '75mg'$  PO QD on 11/12/22. She will continue this through 6 months duration (03/2023). She is scheduled for post LAAO implant. We will send these instructions through [facility]. I will send Plavix into her pharmacy.   Kathyrn Drown NP-C Structural Heart Team  Pager: [phone] Phone: [phone]

## 2022-11-20 ENCOUNTER — Other Ambulatory Visit: Payer: Self-pay | Admitting: Cardiology

## 2022-11-20 DIAGNOSIS — Z95818 Presence of other cardiac implants and grafts: Secondary | ICD-10-CM

## 2022-11-20 DIAGNOSIS — Z01818 Encounter for other preprocedural examination: Secondary | ICD-10-CM

## 2022-11-22 ENCOUNTER — Ambulatory Visit: Payer: Medicare Other | Attending: Cardiology

## 2022-11-22 DIAGNOSIS — Z95818 Presence of other cardiac implants and grafts: Secondary | ICD-10-CM

## 2022-11-22 DIAGNOSIS — Z01818 Encounter for other preprocedural examination: Secondary | ICD-10-CM | POA: Diagnosis not present

## 2022-11-23 LAB — BASIC METABOLIC PANEL
BUN/Creatinine Ratio: 20 (ref 12–28)
BUN: 20 mg/dL (ref 8–27)
CO2: 22 mmol/L (ref 20–29)
Calcium: 9.8 mg/dL (ref 8.7–10.3)
Chloride: 104 mmol/L (ref 96–106)
Creatinine, Ser: 1 mg/dL (ref 0.57–1.00)
Glucose: 91 mg/dL (ref 70–99)
Potassium: 5.4 mmol/L — ABNORMAL HIGH (ref 3.5–5.2)
Sodium: 142 mmol/L (ref 134–144)
eGFR: 62 mL/min/{1.73_m2} (ref >59)

## 2022-11-28 ENCOUNTER — Telehealth (HOSPITAL_COMMUNITY): Payer: Self-pay | Admitting: *Deleted

## 2022-11-28 NOTE — Telephone Encounter (Signed)
Attempted to call patient regarding upcoming cardiac CT appointment. °Left message on voicemail with name and callback number ° °Taci Sterling RN Navigator Cardiac Imaging °[facility] [place] [facility] °[phone] Office °[phone] Cell ° °

## 2022-11-29 ENCOUNTER — Ambulatory Visit (HOSPITAL_COMMUNITY)
Admission: RE | Admit: 2022-11-29 | Discharge: 2022-11-29 | Disposition: A | Discharge status: Home / Self Care | Payer: Medicare Other | Source: Ambulatory Visit | Attending: Cardiology | Admitting: Cardiology

## 2022-11-29 ENCOUNTER — Encounter: Payer: Self-pay | Admitting: Cardiology

## 2022-11-29 DIAGNOSIS — I480 Paroxysmal atrial fibrillation: Secondary | ICD-10-CM | POA: Diagnosis not present

## 2022-11-29 DIAGNOSIS — Z95818 Presence of other cardiac implants and grafts: Secondary | ICD-10-CM | POA: Diagnosis not present

## 2022-11-29 MED ORDER — IOHEXOL 350 MG/ML SOLN
100.0000 mL | Freq: Once | INTRAVENOUS | Status: AC | PRN
  Administered 2022-11-29: 100 mL via INTRAVENOUS

## 2022-12-29 ENCOUNTER — Other Ambulatory Visit: Payer: Self-pay | Admitting: Cardiology

## 2023-01-18 ENCOUNTER — Other Ambulatory Visit: Payer: Self-pay

## 2023-01-18 MED ORDER — ROSUVASTATIN CALCIUM 10 MG PO TABS: 10.0000 mg | ORAL_TABLET | Freq: Every day | ORAL | 3 refills | Status: DC

## 2023-01-24 ENCOUNTER — Other Ambulatory Visit: Payer: Self-pay | Admitting: Cardiology

## 2023-02-05 DIAGNOSIS — I10 Essential (primary) hypertension: Secondary | ICD-10-CM | POA: Diagnosis not present

## 2023-02-05 DIAGNOSIS — M545 Low back pain, unspecified: Secondary | ICD-10-CM | POA: Diagnosis not present

## 2023-02-05 DIAGNOSIS — I480 Paroxysmal atrial fibrillation: Secondary | ICD-10-CM | POA: Diagnosis not present

## 2023-02-05 DIAGNOSIS — E7800 Pure hypercholesterolemia, unspecified: Secondary | ICD-10-CM | POA: Diagnosis not present

## 2023-03-04 ENCOUNTER — Ambulatory Visit: Payer: Medicare Other

## 2023-03-04 ENCOUNTER — Other Ambulatory Visit: Payer: Self-pay

## 2023-03-04 MED ORDER — METOPROLOL SUCCINATE ER 50 MG PO TB24: ORAL_TABLET | ORAL | 7 refills | Status: DC

## 2023-03-05 NOTE — Progress Notes (Deleted)
[facility]                                     Cardiology Office Note:    Date:  03/05/2023   ID:  Leslie Taylor, DOB [birth year], MRN [id number]  PCP:  Noberto Retort, MD  [facility] Cardiologist:  Donato Schultz, MD  [facility] Electrophysiologist:  Lanier Prude, MD   Referring MD: Noberto Retort, MD   No chief complaint on file. ***  History of Present Illness:    Leslie Taylor is a 67 y.o. female with a hx of atrial fibrillation s/p AF ablation with Dr. Lalla Brothers 12/15/21, HTN, and anxiety who underwent LAAO closure with Watchman implantation 09/27/22 and is being seen today for recent LE edema and fatigue.    Ms. Menges was seen by Dr. Lalla Brothers for atrial fibrillation and underwent successful AF ablation 12/15/21. She followed up with Jorja Loa, PA shortly after the procedure and wished to discuss LAAO closure with Watchman to avoid long term anticoagulation due to hx of epistaxis.    CT imaging showed anatomy suitable for Watchman implant. She is now s/p LAAO closure with Watchamn FLX 35mm device. She was restarted on Eliquis with plans to continue this for x 45 days after implant. After 45 days, stop Eliquis and start Plavix 75mg  PO daily to complete 6 months of post implant therapy. Plan for CT/TEE 60 days after implant to evaluate watchman device position/seal.    She recently called the office 2/9 with c/o new onset of lower extremity edema and profound fatigue. In follow up today, she reports that in the several days following her procedure she was feeling great with no issues. She became sick with an upper respiratory infection and began not feeling well. She really was not doing much and really staying in the bed. She then developed bilateral pitting LE edema. This has since subsided without intervention. On exam today, she is rather anxious about her symptoms. She was found to be tachycardic with rates in the low 100's. Out of concern for  recurrent AF, EKG performed that showed ST. She denies chest pain, SOB, orthopnea, dizziness, or syncope. She has some fatigue as stated above. No fevers, N/V, or other signs of infection.     PAF/tachycardia: Referred to Dr. Lalla Brothers for AF ablation performed 12/2021 with LAAO closure to follow. She is now s/p Watchman FLX 35mm device. She was restarted on Eliquis with plans to continue for 45 days. After 45 days, stop Eliquis and start Plavix 75mg  PO daily to complete 6 months of post implant therapy. Plan for CT/TEE 60 days after implant. Having more fatigue since procedure with EKG today showing tachycardia with a rate at 102bpm. Out of concern for recurrent atrial fibrillation, ZIO AT ordered but [employer] will not cover therefore this was converted to ZIO XT. Toprol increased to 75mg  QD due to softer BP. Plan to follow once monitor results. She is already scheduled to see me back 3/6. If monitor has resulted I will keep her telephone visit on 2/26. PRN Lasix given if edema recurs.   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 ECHO 11/06/10 - normal EF, mild MR/TR   Paraesophageal hiatal hernia 2010   lap repair 2010 with biologic mesh   PONV (postoperative nausea and vomiting)    Presence of Watchman left atrial appendage closure device 09/27/2022   Watchman FLX 35mm placed by Dr. Lalla Brothers   Resting tremor 05/26/2013    Past Surgical History:  Procedure Laterality Date   ATRIAL FIBRILLATION ABLATION N/A 12/15/2021   Procedure: ATRIAL FIBRILLATION ABLATION;  Surgeon: Lanier Prude,  MD;  Location: [facility] INVASIVE CV LAB;  Service: Cardiovascular;  Laterality: N/A;   CYSTOSCOPY/RETROGRADE/URETEROSCOPY/STONE EXTRACTION WITH BASKET  2003   Cystoscopy and urethral dilation, right retrograde pyelogram with   LAPAROSCOPIC CHOLECYSTECTOMY W/ CHOLANGIOGRAPHY  2010   LAPAROSCOPIC NISSEN FUNDOPLICATION  2010   LAPAROSCOPIC PARAESOPHAGEAL HERNIA REPAIR  2010   primery with biologic mesh reinforcement   LEFT ATRIAL APPENDAGE OCCLUSION N/A 09/27/2022   Procedure: LEFT ATRIAL APPENDAGE OCCLUSION;  Surgeon: Lanier Prude, MD;  Location: [facility] INVASIVE CV LAB;  Service: Cardiovascular;  Laterality: N/A;   SQUAMOUS CELL CARCINOMA EXCISION  2005   TEE WITHOUT CARDIOVERSION N/A 09/27/2022   Procedure: TRANSESOPHAGEAL ECHOCARDIOGRAM (TEE);  Surgeon: Lanier Prude, MD;  Location: [facility] INVASIVE CV LAB;  Service: Cardiovascular;  Laterality: N/A;    Current Medications: No outpatient medications have been marked as taking for the 03/06/23 encounter (Appointment) with [facility].     Allergies:   Propranolol hcl, Shellfish-derived products, Sulfa antibiotics, and Wellbutrin [bupropion]   Social History   Socioeconomic History   Marital status: Married    Spouse name: Not on file   Number of children: Not on file   Years of education: Not on file   Highest education level: Not on file  Occupational History   Not on file  Tobacco Use   Smoking status: Never   Smokeless tobacco: Never  Vaping Use   Vaping Use: Never used  Substance and Sexual Activity   Alcohol use: Not Currently    Comment: occ   Drug use: No   Sexual activity: Not on file  Other Topics Concern   Not on file  Social History Narrative   Not on file   Social Determinants of Health   Financial Resource Strain: Not on file  Food Insecurity: Not on file  Transportation Needs: Not on file  Physical Activity: Not on file  Stress: Not on file  Social Connections: Not on file     Family  History: The patient's ***family history includes Cancer in her father.  ROS:   Please see the history of present illness.    All other systems reviewed and are negative.  EKGs/Labs/Other Studies Reviewed:    The following studies were reviewed today:   Cardiac Studies & Procedures       ECHOCARDIOGRAM  ECHOCARDIOGRAM COMPLETE 11/10/2021  Narrative ECHOCARDIOGRAM REPORT    Patient Name:   CINIYAH TELLA Date of Exam: 11/10/2021 Medical Rec #:  [id number]        Height:       71.5 in Accession #:    [id number]       Weight:       204.0 lb Date of Birth:  1955-09-17  BSA:          2.137 m Patient Age:    65 years         BP:           126/74 mmHg Patient Gender: F                HR:           89 bpm. Exam Location:  [address]  Procedure: 2D Echo, 3D Echo, Cardiac Doppler, Color Doppler and Strain Analysis  Indications:    I48.91 Atrial fibrillation  History:        Patient has prior history of Echocardiogram examinations, most recent 06/07/2020. Arrythmias:Atrial Fibrillation, Signs/Symptoms:Pre-op evaluation; Risk Factors:Hypertension.  Sonographer:    Samule Ohm RDCS Referring Phys: [id number] Rossie Muskrat LAMBERT  IMPRESSIONS   1. Left ventricular ejection fraction by 3D volume is 67 %. The left ventricle has normal function. The left ventricle has no regional wall motion abnormalities. There is mild asymmetric left ventricular hypertrophy of the septal segment. Left ventricular diastolic parameters were normal. The average left ventricular global longitudinal strain is -19.0 %. The global longitudinal strain is normal. 2. Right ventricular systolic function is normal. The right ventricular size is normal. 3. The mitral valve is normal in structure. No evidence of mitral valve regurgitation. No evidence of mitral stenosis. 4. The aortic valve is normal in structure. Aortic valve regurgitation is not visualized. No aortic stenosis is present. 5. The inferior  vena cava is normal in size with greater than 50% respiratory variability, suggesting right atrial pressure of 3 mmHg.  FINDINGS Left Ventricle: Left ventricular ejection fraction by 3D volume is 67 %. The left ventricle has normal function. The left ventricle has no regional wall motion abnormalities. The average left ventricular global longitudinal strain is -19.0 %. The global longitudinal strain is normal. The left ventricular internal cavity size was normal in size. There is mild asymmetric left ventricular hypertrophy of the septal segment. Left ventricular diastolic parameters were normal.  Right Ventricle: The right ventricular size is normal. No increase in right ventricular wall thickness. Right ventricular systolic function is normal.  Left Atrium: Left atrial size was normal in size.  Right Atrium: Right atrial size was normal in size.  Pericardium: There is no evidence of pericardial effusion.  Mitral Valve: The mitral valve is normal in structure. No evidence of mitral valve regurgitation. No evidence of mitral valve stenosis.  Tricuspid Valve: The tricuspid valve is normal in structure. Tricuspid valve regurgitation is mild . No evidence of tricuspid stenosis.  Aortic Valve: The aortic valve is normal in structure. Aortic valve regurgitation is not visualized. No aortic stenosis is present.  Pulmonic Valve: The pulmonic valve was normal in structure. Pulmonic valve regurgitation is not visualized. No evidence of pulmonic stenosis.  Aorta: The aortic root is normal in size and structure.  Venous: The inferior vena cava is normal in size with greater than 50% respiratory variability, suggesting right atrial pressure of 3 mmHg.  IAS/Shunts: No atrial level shunt detected by color flow Doppler.   LEFT VENTRICLE PLAX 2D LVIDd:         4.30 cm         Diastology LVIDs:         3.00 cm         LV e' medial:    7.40 cm/s LV PW:         0.90 cm         LV  E/e' medial:  7.5 LV  IVS:        1.20 cm         LV e' lateral:   9.79 cm/s LVOT diam:     1.90 cm         LV E/e' lateral: 5.7 LV SV:         62 LV SV Index:   29              2D LVOT Area:     2.84 cm        Longitudinal Strain 2D Strain GLS  -19.0 % Avg:  3D Volume EF LV 3D EF:    Left ventricul ar ejection fraction by 3D volume is 67 %.  3D Volume EF: 3D EF:        67 % LV EDV:       92 ml LV ESV:       31 ml LV SV:        62 ml  RIGHT VENTRICLE             IVC RV S prime:     15.40 cm/s  IVC diam: 1.10 cm TAPSE (M-mode): 2.2 cm RVSP:           23.2 mmHg  LEFT ATRIUM             Index        RIGHT ATRIUM           Index LA diam:        3.60 cm 1.68 cm/m   RA Pressure: 3.00 mmHg LA Vol (A2C):   49.6 ml 23.20 ml/m  RA Area:     12.70 cm LA Vol (A4C):   50.6 ml 23.67 ml/m  RA Volume:   30.70 ml  14.36 ml/m LA Biplane Vol: 54.5 ml 25.50 ml/m AORTIC VALVE LVOT Vmax:   115.00 cm/s LVOT Vmean:  70.000 cm/s LVOT VTI:    0.218 m  AORTA Ao Root diam: 3.50 cm Ao Asc diam:  3.80 cm  MITRAL VALVE               TRICUSPID VALVE MV Area (PHT): 3.85 cm    TR Peak grad:   20.2 mmHg MV Decel Time: 197 msec    TR Vmax:        225.00 cm/s MV E velocity: 55.50 cm/s  Estimated RAP:  3.00 mmHg MV A velocity: 57.10 cm/s  RVSP:           23.2 mmHg MV E/A ratio:  0.97 SHUNTS Systemic VTI:  0.22 m Systemic Diam: 1.90 cm  Kardie Tobb DO Electronically signed by Thomasene Ripple DO Signature Date/Time: 11/10/2021/2:01:17 PM    Final   TEE  ECHO TEE 09/27/2022  Narrative TRANSESOPHOGEAL ECHO REPORT    Patient Name:   CELESTE GONG Date of Exam: 09/27/2022 Medical Rec #:  [id number]        Height:       71.0 in Accession #:    [id number]       Weight:       200.0 lb Date of Birth:  1956/02/18        BSA:          2.109 m Patient Age:    66 years         BP:           132/69 mmHg Patient Gender: F  HR:           82 bpm. Exam Location:  Inpatient  Procedure:  Transesophageal Echo, Color Doppler and Cardiac Doppler  Indications:     PAF (paroxysmal atrial fibrillation) (HCC) [I48.0 (ICD-10-CM)]; Chronic anticoagulation [Z79.01 (ICD-10-CM)]  History:         Patient has prior history of Echocardiogram examinations. Arrythmias:Atrial Fibrillation; Risk Factors:Hypertension. Watchman 35mm placed on 09/27/2022.  Sonographer:     Leta Jungling RDCS Referring Phys:  [id number] Lanier Prude Diagnosing Phys: Thurmon Fair MD  PROCEDURE: After discussion of the risks and benefits of a TEE, an informed consent was obtained from the patient. The patient was intubated. TEE procedure time was 61 minutes. The transesophogeal probe was passed without difficulty through the esophogus of the patient. Imaged were obtained with the patient in a supine position. Sedation performed by different physician. The patient was monitored while under deep sedation. Image quality was good. The patient developed no complications during the procedure. Interventional TEE for LAAO procedure. Live and postprocessed 3D imaging was used throughout the procedure.  Prior to procedure, there was no evidence of LAA thrombus. Maximal left atrial apendage dimension was 26mm, with sufficient depth for a 35mm Watchman FLX device. Transseptal puncture performed without complication. Placement of the device was optimized with no peri-device leak. Post procedure there was a tiny left to right shunt through a tiny iatrogenic atrial septal defect. No change in pericardial effusion size before and after case.  IMPRESSIONS   1. Left ventricular ejection fraction, by estimation, is 65 to 70%. The left ventricle has normal function. 2. Right ventricular systolic function is normal. The right ventricular size is normal. 3. No left atrial/left atrial appendage thrombus was detected. 4. The mitral valve is grossly normal. Trivial mitral valve regurgitation. No evidence of mitral stenosis. 5. The  aortic valve is tricuspid. Aortic valve regurgitation is not visualized. No aortic stenosis is present. 6. Evidence of atrial level shunting detected by color flow Doppler. There is a small patent foramen ovale with predominantly left to right shunting across the atrial septum.  FINDINGS Left Ventricle: Left ventricular ejection fraction, by estimation, is 65 to 70%. The left ventricle has normal function. The left ventricular internal cavity size was normal in size.  Right Ventricle: The right ventricular size is normal. No increase in right ventricular wall thickness. Right ventricular systolic function is normal.  Left Atrium: Left atrial size was normal in size. No left atrial/left atrial appendage thrombus was detected.  Right Atrium: Right atrial size was normal in size. Prominent Eustachian valve.  Pericardium: Trivial pericardial effusion is present. The pericardial effusion is localized near the right atrium and anterior to the right ventricle.  Mitral Valve: The mitral valve is grossly normal. Trivial mitral valve regurgitation. No evidence of mitral valve stenosis.  Tricuspid Valve: The tricuspid valve is grossly normal. Tricuspid valve regurgitation is trivial. No evidence of tricuspid stenosis.  Aortic Valve: The aortic valve is tricuspid. Aortic valve regurgitation is not visualized. No aortic stenosis is present.  Pulmonic Valve: The pulmonic valve was grossly normal. Pulmonic valve regurgitation is trivial. No evidence of pulmonic stenosis.  Aorta: The aortic root and ascending aorta are structurally normal, with no evidence of dilitation.  IAS/Shunts: Evidence of atrial level shunting detected by color flow Doppler. A small patent foramen ovale is detected with predominantly left to right shunting across the atrial septum.  Thurmon Fair MD Electronically signed by Thurmon Fair MD Signature Date/Time: 09/27/2022/3:25:58 PM  Final   MONITORS  LONG TERM MONITOR  (3-14 DAYS) 11/07/2022  Narrative HR 59 - 142 bpm, average 93 bpm. Rare supraventricular and ventricular ectopy. No atrial fibrillation detected. No sustained arrhythmias.  Sheria Lang T. Lalla Brothers, MD, [facility], [facility] Cardiac Electrophysiology            EKG:  EKG is *** ordered today.  The ekg ordered today demonstrates ***  Recent Labs: 09/05/2022: Hemoglobin 12.4; Platelets 286 11/22/2022: BUN 20; Creatinine, Ser 1.00; Potassium 5.4; Sodium 142  Recent Lipid Panel    Component Value Date/Time   CHOL 148 03/14/2022 1151   TRIG 127 03/14/2022 1151   HDL 54 03/14/2022 1151   CHOLHDL 2.7 03/14/2022 1151   CHOLHDL 3.7 03/14/2009 0515   VLDL 18 03/14/2009 0515   LDLCALC 72 03/14/2022 1151     Risk Assessment/Calculations:   {Does this patient have ATRIAL FIBRILLATION?:[phone]}   CHA2DS2-VASc Score = 4 [CHF History: 0, HTN History: 1, Diabetes History: 0, Stroke History: 0, Vascular Disease History: 1, Age Score: 1, Gender Score: 1].  Therefore, the patient's annual risk of stroke is 4.8 %.        HAS-BLED score *** Hypertension (Uncontrolled in 30 days)  {YES/NO:21197} Abnormal renal and liver function (Dialysis, transplant, Cr >2.26 mg/dL /Cirrhosis or Bilirubin >2x Normal or AST/ALT/AP >3x Normal) {YES/NO:21197} Stroke {YES/NO:21197} Bleeding {YES/NO:21197} Labile INR (Unstable/high INR) {YES/NO:21197} Elderly (>65) {YES/NO:21197} Drugs or alcohol (? 8 drinks/week, anti-plt or NSAID) {YES/NO:21197}   Physical Exam:    VS:  There were no vitals taken for this visit.    Wt Readings from Last 3 Encounters:  10/19/22 199 lb 9.6 oz (90.5 kg)  09/27/22 200 lb (90.7 kg)  09/05/22 202 lb 3.2 oz (91.7 kg)     GEN: *** Well nourished, well developed in no acute distress HEENT: Normal NECK: No JVD; No carotid bruits LYMPHATICS: No lymphadenopathy CARDIAC: ***RRR, no murmurs, rubs, gallops RESPIRATORY:  Clear to auscultation without rales, wheezing or rhonchi  ABDOMEN: Soft,  non-tender, non-distended MUSCULOSKELETAL:  No edema; No deformity  SKIN: Warm and dry NEUROLOGIC:  Alert and oriented x 3 PSYCHIATRIC:  Normal affect   ASSESSMENT:    No diagnosis found. PLAN:    In order of problems listed above:       {Are you ordering a CV Procedure (e.g. stress test, cath, DCCV, TEE, etc)?   Press F2        :[id number]}    Medication Adjustments/Labs and Tests Ordered: Current medicines are reviewed at length with the patient today.  Concerns regarding medicines are outlined above.  No orders of the defined types were placed in this encounter.  No orders of the defined types were placed in this encounter.   There are no Patient Instructions on file for this visit.   Signed, Georgie Chard, NP  03/05/2023 12:37 PM    [facility]

## 2023-03-06 ENCOUNTER — Ambulatory Visit: Payer: Medicare Other

## 2023-03-08 NOTE — Progress Notes (Unsigned)
Virtual Visit via Telephone Note   Because of Leslie Taylor co-morbid illnesses, she is at least at moderate risk for complications without adequate follow up.  This format is felt to be most appropriate for this patient at this time.  The patient did not have access to video technology/had technical difficulties with video requiring transitioning to audio format only (telephone).  All issues noted in this document were discussed and addressed.  No physical exam could be performed with this format.  Please refer to the patient's chart for her consent to telehealth for [facility].   Date:  03/08/2023   ID:  Leslie Taylor, DOB 1956-08-12, MRN [id number] The patient was identified using 2 identifiers.  Patient Location: Home Provider Location: Office/Clinic  PCP:  Noberto Retort, MD   [facility] Providers Cardiologist:  Donato Schultz, MD Electrophysiologist:  Lanier Prude, MD  }  Evaluation Performed:  Follow-Up Visit  Chief Complaint:  6 month s/p LAAO  History of Present Illness:    Leslie SCHRANZ is a 67 y.o. female with a hx of atrial fibrillation s/p AF ablation with Dr. Lalla Brothers 12/15/21, HTN, and anxiety who underwent LAAO closure with Watchman 09/27/22 and is being seen today for 6 month follow up.    Ms. Bustamante was seen by Dr. Lalla Brothers for atrial fibrillation and underwent successful AF ablation 12/15/21. She followed up with Jorja Loa, PA shortly after the procedure and wished to discuss LAAO closure with Watchman to avoid long term anticoagulation due to hx of epistaxis.    CT imaging showed anatomy suitable for Watchman implant. She is now s/p LAAO closure with Watchamn FLX 35mm device 09/27/22. She was restarted on Eliquis  x 45 days after implant then transitioned to Plavix 75mg  to complete 6 months of post implant therapy.    Today she was evaluated via telephone and reports that she has been doing exceptionally well. Reports no further  palpitations since we last talked. She denies chest pain, SOB, LE edema, orthopnea, PND, dizziness, or syncope. Denies bleeding in stool or urine.   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 ECHO 11/06/10 - normal EF, mild MR/TR   Paraesophageal hiatal hernia 2010   lap repair 2010 with biologic mesh   PONV (postoperative nausea and vomiting)    Presence of Watchman left atrial appendage closure device 09/27/2022   Watchman FLX 35mm placed by Dr. Lalla Brothers   Resting tremor 05/26/2013   Past Surgical History:  Procedure Laterality Date   ATRIAL FIBRILLATION ABLATION N/A 12/15/2021   Procedure: ATRIAL FIBRILLATION ABLATION;  Surgeon: Lanier Prude, MD;  Location: [facility] INVASIVE CV LAB;  Service: Cardiovascular;  Laterality: N/A;   CYSTOSCOPY/RETROGRADE/URETEROSCOPY/STONE EXTRACTION WITH BASKET  2003   Cystoscopy and urethral dilation, right retrograde pyelogram with   LAPAROSCOPIC CHOLECYSTECTOMY W/ CHOLANGIOGRAPHY  2010   LAPAROSCOPIC NISSEN FUNDOPLICATION  2010   LAPAROSCOPIC PARAESOPHAGEAL HERNIA REPAIR  2010   primery with biologic mesh reinforcement  LEFT ATRIAL APPENDAGE OCCLUSION N/A 09/27/2022   Procedure: LEFT ATRIAL APPENDAGE OCCLUSION;  Surgeon: Lanier Prude, MD;  Location: [facility] INVASIVE CV LAB;  Service: Cardiovascular;  Laterality: N/A;   SQUAMOUS CELL CARCINOMA EXCISION  2005   TEE WITHOUT CARDIOVERSION N/A 09/27/2022   Procedure: TRANSESOPHAGEAL ECHOCARDIOGRAM (TEE);  Surgeon: Lanier Prude, MD;  Location: [facility] INVASIVE CV LAB;  Service: Cardiovascular;   Laterality: N/A;     No outpatient medications have been marked as taking for the 03/11/23 encounter (Appointment) with [facility].     Allergies:   Propranolol hcl, Shellfish-derived products, Sulfa antibiotics, and Wellbutrin [bupropion]   Social History   Tobacco Use   Smoking status: Never   Smokeless tobacco: Never  Vaping Use   Vaping Use: Never used  Substance Use Topics   Alcohol use: Not Currently    Comment: occ   Drug use: No    Family Hx: The patient's family history includes Cancer in her father.  ROS:   Please see the history of present illness.     All other systems reviewed and are negative.  Prior CV studies:   The following studies were reviewed today:  Watchman 09/27/22: CONCLUSIONS:  1.Successful implantation of a WATCHMAN left atrial appendage occlusive device    2. TEE demonstrating no LAA thrombus 3. No early apparent complications.      Post Implant Anticoagulation Strategy: Continue Eliquis 5mg  PO BID x 45 days after implant. After 45 days, stop Eliquis and start Plavix 75mg  PO daily to complete 6 months of post implant therapy. Plan for CT/TEE 60 days after implant to evaluate watchman device position/seal.  CT 11/06/22: There is 35 mm Watchman FLX device. Average compression 14%. Prominent coumadin ridge adjacent to the device without evidence of thrombus. There is no peri-device leak. Small shoulder.   Notes on transseptal puncture: Residual left to right iatrogenic ASD is noted.   Small PFO noted. IMPRESSION: 1.  Successful Watchman FLX placement.  Labs/Other Tests and Data Reviewed:    EKG:  No ECG reviewed.  Recent Labs: 09/05/2022: Hemoglobin 12.4; Platelets 286 11/22/2022: BUN 20; Creatinine, Ser 1.00; Potassium 5.4; Sodium 142   Recent Lipid Panel Lab Results  Component Value Date/Time   CHOL 148 03/14/2022 11:51 AM   TRIG 127 03/14/2022 11:51 AM   HDL 54 03/14/2022 11:51 AM   CHOLHDL 2.7 03/14/2022  11:51 AM   CHOLHDL 3.7 03/14/2009 05:15 AM   LDLCALC 72 03/14/2022 11:51 AM   Wt Readings from Last 3 Encounters:  10/19/22 199 lb 9.6 oz (90.5 kg)  09/27/22 200 lb (90.7 kg)  09/05/22 202 lb 3.2 oz (91.7 kg)       Objective:    Vital Signs:  There were no vitals taken for this visit.   VITAL SIGNS:  reviewed GEN:  no acute distress  ASSESSMENT & PLAN:    PAF: Underwent AF ablation and is now s/p LAAO closure with Watchman FLX 35mm device 09/27/22. She was restarted on Eliquis x 45 days then transitioned to Plavix to complete 6 months of post implant therapy. Post implant imaging with no significant device leak or thrombus. Plan to stop Plavix for last dose of Plavix 7/25 then stop. No longer requires dental SBE. Plan 6 month follow up with Dr. Anne Fu and 1 year telephone visit with our team if no other appointments surrounding this time.   Time:   Today, I have spent 10 minutes with the patient with telehealth technology discussing the  above problems.     Medication Adjustments/Labs and Tests Ordered: Current medicines are reviewed at length with the patient today.  Concerns regarding medicines are outlined above.   Tests Ordered: No orders of the defined types were placed in this encounter.   Medication Changes: No orders of the defined types were placed in this encounter.   Follow Up:  In Person in 6 month(s) with Dr. Anne Fu  Signed, Georgie Chard, NP  03/08/2023 2:59 PM    [facility]

## 2023-03-11 ENCOUNTER — Ambulatory Visit: Payer: Medicare Other | Attending: Cardiology

## 2023-03-11 DIAGNOSIS — Z95818 Presence of other cardiac implants and grafts: Secondary | ICD-10-CM | POA: Diagnosis not present

## 2023-03-11 DIAGNOSIS — I480 Paroxysmal atrial fibrillation: Secondary | ICD-10-CM

## 2023-03-11 MED ORDER — CLOPIDOGREL BISULFATE 75 MG PO TABS: 75.0000 mg | ORAL_TABLET | Freq: Every day | ORAL | 2 refills | Status: DC

## 2023-03-11 NOTE — Patient Instructions (Signed)
Medication Instructions:  Your physician has recommended you make the following change in your medication:  STOP PLAVIX ON 03/28/23   *If you need a refill on your cardiac medications before your next appointment, please call your pharmacy*   Lab Work: NONE If you have labs (blood work) drawn today and your tests are completely normal, you will receive your results only by: MyChart Message (if you have MyChart) OR A paper copy in the mail If you have any lab test that is abnormal or we need to change your treatment, we will call you to review the results.   Testing/Procedures: NONE   Follow-Up: At [facility], you and your health needs are our priority.  As part of our continuing mission to provide you with exceptional heart care, we have created designated Provider Care Teams.  These Care Teams include your primary Cardiologist (physician) and Advanced Practice Providers (APPs -  Physician Assistants and Nurse Practitioners) who all work together to provide you with the care you need, when you need it.  We recommend signing up for the patient portal called "MyChart".  Sign up information is provided on this After Visit Summary.  MyChart is used to connect with patients for Virtual Visits (Telemedicine).  Patients are able to view lab/test results, encounter notes, upcoming appointments, etc.  Non-urgent messages can be sent to your provider as well.   To learn more about what you can do with MyChart, go to [email or web address].    Your next appointment:   6 month(s)  Provider:   Donato Schultz, MD

## 2023-03-28 ENCOUNTER — Other Ambulatory Visit: Payer: Self-pay | Admitting: Cardiology

## 2023-04-23 ENCOUNTER — Telehealth: Payer: Self-pay | Admitting: Cardiology

## 2023-04-23 DIAGNOSIS — K080 Exfoliation of teeth due to systemic causes: Secondary | ICD-10-CM | POA: Diagnosis not present

## 2023-04-23 NOTE — Telephone Encounter (Signed)
Requesting office made aware that recommendations would need to come from primary care

## 2023-04-23 NOTE — Telephone Encounter (Signed)
  [name] called back, they requested to have a letter indicating the pt can take lorazepam

## 2023-04-23 NOTE — Telephone Encounter (Signed)
    Primary Cardiologist: Donato Schultz, MD  Chart reviewed as part of pre-operative protocol coverage. Simple dental extractions are considered low risk procedures per guidelines and generally do not require any specific cardiac clearance. It is also generally accepted that for simple extractions and dental cleanings, there is no need to interrupt blood thinner therapy.   SBE prophylaxis is not required for the patient.  I will route this recommendation to the requesting party via Epic fax function and remove from pre-op pool.  Please call with questions.  Ronney Asters, NP 04/23/2023, 11:09 AM

## 2023-04-23 NOTE — Telephone Encounter (Signed)
   Pre-operative Risk Assessment    Patient Name: Leslie Taylor  DOB: Feb 06, 1956 MRN: [id number]     Request for Surgical Clearance    Procedure:  Dental Extraction - Amount of Teeth to be Pulled:  1   Date of Surgery:  Clearance 04/23/23     Patient is currently in the chair                             Surgeon:  Dr. Fonnie Jarvis  Surgeon's Group or Practice Name:  [name]  Phone number:  [phone] Fax number:  [phone]   Type of Clearance Requested:   - Medical  - Pharmacy:  Hold        Type of Anesthesia:  Local    Additional requests/questions:   Can the pt be given Urban Gibson Bolick   04/23/2023, 10:54 AM

## 2023-04-24 DIAGNOSIS — K080 Exfoliation of teeth due to systemic causes: Secondary | ICD-10-CM | POA: Diagnosis not present

## 2023-06-10 DIAGNOSIS — Z1231 Encounter for screening mammogram for malignant neoplasm of breast: Secondary | ICD-10-CM | POA: Diagnosis not present

## 2023-07-02 DIAGNOSIS — R922 Inconclusive mammogram: Secondary | ICD-10-CM | POA: Diagnosis not present

## 2023-09-02 DIAGNOSIS — I10 Essential (primary) hypertension: Secondary | ICD-10-CM | POA: Diagnosis not present

## 2023-09-02 DIAGNOSIS — F5101 Primary insomnia: Secondary | ICD-10-CM | POA: Diagnosis not present

## 2023-09-02 DIAGNOSIS — E7800 Pure hypercholesterolemia, unspecified: Secondary | ICD-10-CM | POA: Diagnosis not present

## 2023-09-02 DIAGNOSIS — M545 Low back pain, unspecified: Secondary | ICD-10-CM | POA: Diagnosis not present

## 2023-10-03 DIAGNOSIS — E559 Vitamin D deficiency, unspecified: Secondary | ICD-10-CM | POA: Diagnosis not present

## 2023-10-03 DIAGNOSIS — E7800 Pure hypercholesterolemia, unspecified: Secondary | ICD-10-CM | POA: Diagnosis not present

## 2023-10-03 DIAGNOSIS — I10 Essential (primary) hypertension: Secondary | ICD-10-CM | POA: Diagnosis not present

## 2023-10-14 ENCOUNTER — Other Ambulatory Visit: Payer: Self-pay

## 2023-10-14 MED ORDER — METOPROLOL SUCCINATE ER 50 MG PO TB24: ORAL_TABLET | ORAL | 5 refills | Status: DC

## 2023-10-30 ENCOUNTER — Telehealth: Payer: Self-pay

## 2023-10-30 NOTE — Telephone Encounter (Signed)
 Called to check in with patient ~1 year after LAAO (procedure date 09/27/2022).  Left message to call back.

## 2023-11-01 NOTE — Telephone Encounter (Signed)
 The patient reported that from a Watchman perspective, she is doing great. She loves being off her a/c.  While on the phone, the patient stated she has had a few bouts of CP over the last 3 or so weeks. She does not active CP now, but she is more fatigued than usual.  Scheduled the patient for visit with Eligha Bridegroom 3/20.  She understands a message will be sent to Dr. Anne Fu' nurse and if there are cancellations she will be called. ER precautions reviewed.

## 2023-11-21 ENCOUNTER — Ambulatory Visit: Payer: Medicare Other | Admitting: Nurse Practitioner

## 2023-11-21 NOTE — Progress Notes (Deleted)
 Cardiology Office Note:  .   Date:  11/21/2023  ID:  Leslie Taylor, DOB Mar 02, 1956, MRN [id number] PCP: Noberto Retort, MD  [facility] Providers Cardiologist:  Donato Schultz, MD Electrophysiologist:  Lanier Prude, MD { Click to update primary MD,subspecialty MD or APP then REFRESH:1}   Patient Profile: .      PMH Atrial fibrillation S/p ablation 12/15/2021 S/p LAAO closure with watchman implantation 09/27/2022 Anemia Epistaxis Hypertension Hyperlipidemia Anxiety  She established with Dr. Anne Fu 05/2020 for management of atrial fibrillation.  She had been seen in cardiology in 2012 with a nuclear stress test at that time that showed no ischemia.  Echocardiogram showed normal EF with mild MR and left atrial size of 35 mm.  She had paroxysmal atrial fibrillation detected on the monitor 11/2010.  She had previously been on flecainide.  History of CVA in mother and A-fib in her siblings.  She had increasing episodes of atrial fibrillation and was referred to EP, seen by Dr. Lalla Brothers 07/06/2021.  She underwent successful atrial fibrillation ablation 12/15/2021.  She had pleuritic chest pain consistent with pericarditis following the procedure that was treated with ibuprofen and colchicine.  She was deemed a poor candidate for long-term anticoagulation due to epistaxis.  She underwent successful implantation of Watchman left atrial appendage occlusive device on 09/27/2022.  She continued Eliquis for 45 days after implant and started Plavix 75 mg daily for 6 months post implant therapy.  CT 11/29/2022 revealed successful watchman placement.  Seen by Georgie Chard, NP on 10/19/2022 with new onset LE edema and fatigue.  Normal heart function noted on TEE 09/27/2022. She reported recent URI and subsequent decreased activity.  She developed bilateral pitting LE edema that then subsided without intervention.  She was concerned about elevated heart rate in the low 100s.  Cardiac monitor was  ordered and completed 11/07/2022 with rare supraventricular and ventricular ectopy, no atrial fibrillation or sustained arrhythmias, average HR 93 bpm.         History of Present Illness: .   Leslie Taylor is a *** 68 y.o. female who is here today for evaluation of chest pain   Discussed the use of AI scribe software for clinical note transcription with the patient, who gave verbal consent to proceed.   ROS: ***       Studies Reviewed: Marland Kitchen         No results found for: "LIPOA"   *** Risk Assessment/Calculations:   {Does this patient have ATRIAL FIBRILLATION?:[phone]} No BP recorded.  {Refresh Note OR Click here to enter BP  :1}***       Physical Exam:   VS:  There were no vitals taken for this visit.   Wt Readings from Last 3 Encounters:  10/19/22 199 lb 9.6 oz (90.5 kg)  09/27/22 200 lb (90.7 kg)  09/05/22 202 lb 3.2 oz (91.7 kg)    GEN: Well nourished, well developed in no acute distress NECK: No JVD; No carotid bruits CARDIAC: ***RRR, no murmurs, rubs, gallops RESPIRATORY:  Clear to auscultation without rales, wheezing or rhonchi  ABDOMEN: Soft, non-tender, non-distended EXTREMITIES:  No edema; No deformity     ASSESSMENT AND PLAN: .    Chest pain: Coronary artery calcification: CT 12/07/2021 revealed calcium score of 138 (83rd percentile) S/p AF Ablation: S/p LAAO closure implant:  Hyperlipidemia: Hypertension:    {Are you ordering a CV Procedure (e.g. stress test, cath, DCCV, TEE, etc)?   Press F2        :  [id number]}  Disposition:***  Signed, Eligha Bridegroom, NP-C

## 2023-12-06 ENCOUNTER — Other Ambulatory Visit: Payer: Self-pay

## 2023-12-06 MED ORDER — ROSUVASTATIN CALCIUM 10 MG PO TABS: 10.0000 mg | ORAL_TABLET | Freq: Every day | ORAL | 0 refills | Status: DC

## 2024-02-17 ENCOUNTER — Other Ambulatory Visit: Payer: Self-pay

## 2024-02-17 MED ORDER — ROSUVASTATIN CALCIUM 10 MG PO TABS
10.0000 mg | ORAL_TABLET | Freq: Every day | ORAL | 0 refills | Status: DC
Start: 2024-02-17 — End: 2024-05-13

## 2024-02-25 NOTE — Progress Notes (Signed)
 Cardiology Office Note:  .   Date:  03/10/2024  ID:  Leslie Taylor, DOB 01-18-1956, MRN [id number] PCP: Arloa Elsie SAUNDERS, MD  [facility] Providers Cardiologist:  Oneil Parchment, MD Electrophysiologist:  OLE ONEIDA HOLTS, MD    History of Present Illness: .   Leslie Taylor is a 68 y.o. female   with a hx of atrial fibrillation s/p AF ablation with Dr. HOLTS 12/15/21, HTN, and anxiety who underwent LAAO closure with Watchman 09/27/22 Leslie Taylor was seen by Dr. HOLTS for atrial fibrillation and underwent successful AF ablation 12/15/21. She followed up with Daril Kicks, PA shortly after the procedure and wished to discuss LAAO closure with Watchman to avoid long term anticoagulation due to hx of epistaxis.     Leslie Taylor was seen by Dr. HOLTS for atrial fibrillation and underwent successful AF ablation 12/15/21. She followed up with Daril Kicks, PA shortly after the procedure and wished to discuss LAAO closure with Watchman to avoid long term anticoagulation due to hx of epistaxis.    CT imaging showed anatomy suitable for Watchman implant. She is now s/p LAAO closure with Watchamn FLX 35mm device 09/27/22. She was restarted on Eliquis   x 45 days after implant then transitioned to Plavix  75mg  to complete 6 months of post implant therapy.    Patient complains of a tugging or grabbing pain in her chest when she bends over or working at the sink. Takes her breath away and then passes. Has been going on for a couple months. Lasts less than a minute. Hasn't strained herself. Occurs about once a week or so. She does a lot of yard work-no symptoms, takes care of grandchildren. Thinking about joining the [facility]. BP running 140/70 at home. Watches salt closely.      ROS:    Studies Reviewed: SABRA    EKG Interpretation Date/Time:  Tuesday March 10 2024 09:38:13 EDT Ventricular Rate:  97 PR Interval:  144 QRS Duration:  84 QT Interval:  350 QTC Calculation: 444 R Axis:   25  Text  Interpretation: Normal sinus rhythm Normal ECG When compared with ECG of 28-Sep-2022 06:25, No significant change was found Confirmed by Parthenia Klinefelter [phone]) on 03/10/2024 9:47:17 AM    Prior CV Studies:    Watchman 09/27/22: CONCLUSIONS:  1.Successful implantation of a WATCHMAN left atrial appendage occlusive device    2. TEE demonstrating no LAA thrombus 3. No early apparent complications.      Post Implant Anticoagulation Strategy: Continue Eliquis  5mg  PO BID x 45 days after implant. After 45 days, stop Eliquis  and start Plavix  75mg  PO daily to complete 6 months of post implant therapy. Plan for CT/TEE 60 days after implant to evaluate watchman device position/seal.   CT 11/06/22: There is 35 mm Watchman FLX device. Average compression 14%. Prominent coumadin ridge adjacent to the device without evidence of thrombus. There is no peri-device leak. Small shoulder.   Notes on transseptal puncture: Residual left to right iatrogenic ASD is noted.   Small PFO noted. IMPRESSION: 1.  Successful Watchman FLX placement.  CT 2023 IMPRESSION: 1. Left atrial appendage is windsock morphology with landing zone measuring 30mm x 24mm in diameter with depth 33mm, appropriate for placement of 35mm Watchman FLX device   2. There is normal pulmonary vein drainage into the left atrium. Measurements as reported   3.  There is no thrombus in the left atrial appendage.   4. Esophagus courses posterior to ostium of left lower pulmonary vein  5.  Coronary calcium  score 138 (83rd percentile)     Electronically Signed   By: Lonni Nanas M.D.   On: 12/07/2021 12:00  Risk Assessment/Calculations:    CHA2DS2-VASc Score = 4   This indicates a 4.8% annual risk of stroke. The patient's score is based upon: CHF History: 0 HTN History: 1 Diabetes History: 0 Stroke History: 0 Vascular Disease History: 1 Age Score: 1 Gender Score: 1            Physical Exam:   VS:  BP 130/75    Pulse 97   Ht 5' 11 (1.803 m)   Wt 216 lb (98 kg)   SpO2 92%   BMI 30.13 kg/m    Orhtostatics: No data found. Wt Readings from Last 3 Encounters:  03/10/24 216 lb (98 kg)  10/19/22 199 lb 9.6 oz (90.5 kg)  09/27/22 200 lb (90.7 kg)    GEN: Well nourished, well developed in no acute distress NECK: No JVD; No carotid bruits CARDIAC:  RRR, no murmurs, rubs, gallops RESPIRATORY:  Clear to auscultation without rales, wheezing or rhonchi  ABDOMEN: Soft, non-tender, non-distended EXTREMITIES:  No edema; No deformity   ASSESSMENT AND PLAN: .    Chest pain-grabbing, fleeting, takes her breath away. History of CAC 138 2023. Will order GXT myoview. Hold metoprolol  the night before. Labs doene by PCP recently-will request copy.   Elevated CAC on crestor  10 mg daily-try to get recent labs.  PAF: Underwent AF ablation and is now s/p LAAO closure with Watchman FLX 35mm device 09/27/22. She was restarted on Eliquis  x 45 days then transitioned to Plavix  to complete 6 months of post implant therapy. Post implant imaging with no significant device leak or thrombus. Now off Plavix . No longer requires dental SBE.  HTN-BP up initially but came down on recheck. Watches salt closely.    Informed Consent   Shared Decision Making/Informed Consent The risks [chest pain, shortness of breath, cardiac arrhythmias, dizziness, blood pressure fluctuations, myocardial infarction, stroke/transient ischemic attack, nausea, vomiting, allergic reaction, radiation exposure, metallic taste sensation and life-threatening complications (estimated to be 1 in 10,000)], benefits (risk stratification, diagnosing coronary artery disease, treatment guidance) and alternatives of a nuclear stress test were discussed in detail with Leslie Taylor and she agrees to proceed.     Dispo: f/u in 1 yr Dr. Jeffrie pending above testing.   Signed, Olivia Pavy, PA-C

## 2024-02-28 DIAGNOSIS — Z23 Encounter for immunization: Secondary | ICD-10-CM | POA: Diagnosis not present

## 2024-02-28 DIAGNOSIS — F5101 Primary insomnia: Secondary | ICD-10-CM | POA: Diagnosis not present

## 2024-02-28 DIAGNOSIS — I480 Paroxysmal atrial fibrillation: Secondary | ICD-10-CM | POA: Diagnosis not present

## 2024-02-28 DIAGNOSIS — R946 Abnormal results of thyroid function studies: Secondary | ICD-10-CM | POA: Diagnosis not present

## 2024-02-28 DIAGNOSIS — Z0000 Encounter for general adult medical examination without abnormal findings: Secondary | ICD-10-CM | POA: Diagnosis not present

## 2024-02-28 DIAGNOSIS — I10 Essential (primary) hypertension: Secondary | ICD-10-CM | POA: Diagnosis not present

## 2024-02-28 DIAGNOSIS — E7800 Pure hypercholesterolemia, unspecified: Secondary | ICD-10-CM | POA: Diagnosis not present

## 2024-03-10 ENCOUNTER — Encounter: Payer: Self-pay | Admitting: Physician Assistant

## 2024-03-10 ENCOUNTER — Ambulatory Visit: Attending: Physician Assistant | Admitting: Physician Assistant

## 2024-03-10 VITALS — BP 130/75 | HR 97 | Ht 71.0 in | Wt 216.0 lb

## 2024-03-10 DIAGNOSIS — I2510 Atherosclerotic heart disease of native coronary artery without angina pectoris: Secondary | ICD-10-CM | POA: Diagnosis not present

## 2024-03-10 DIAGNOSIS — I10 Essential (primary) hypertension: Secondary | ICD-10-CM

## 2024-03-10 DIAGNOSIS — R0789 Other chest pain: Secondary | ICD-10-CM | POA: Diagnosis not present

## 2024-03-10 DIAGNOSIS — Z95818 Presence of other cardiac implants and grafts: Secondary | ICD-10-CM | POA: Diagnosis not present

## 2024-03-10 DIAGNOSIS — I480 Paroxysmal atrial fibrillation: Secondary | ICD-10-CM

## 2024-03-10 NOTE — Patient Instructions (Signed)
 Medication Instructions:  Your physician recommends that you continue on your current medications as directed. Please refer to the Current Medication list given to you today.  *If you need a refill on your cardiac medications before your next appointment, please call your pharmacy*  Lab Work: NONE If you have labs (blood work) drawn today and your tests are completely normal, you will receive your results only by: MyChart Message (if you have MyChart) OR A paper copy in the mail If you have any lab test that is abnormal or we need to change your treatment, we will call you to review the results.  Testing/Procedures: Your physician has requested that you have en exercise stress myoview. For further information please visit [email or web address] Please follow instruction sheet, as given.   Follow-Up: At [facility], you and your health needs are our priority.  As part of our continuing mission to provide you with exceptional heart care, our providers are all part of one team.  This team includes your primary Cardiologist (physician) and Advanced Practice Providers or APPs (Physician Assistants and Nurse Practitioners) who all work together to provide you with the care you need, when you need it.  Your next appointment:   1 year(s)  Provider:   Oneil Parchment, MD   We recommend signing up for the patient portal called MyChart.  Sign up information is provided on this After Visit Summary.  MyChart is used to connect with patients for Virtual Visits (Telemedicine).  Patients are able to view lab/test results, encounter notes, upcoming appointments, etc.  Non-urgent messages can be sent to your provider as well.   To learn more about what you can do with MyChart, go to [email or web address].

## 2024-03-20 DIAGNOSIS — M160 Bilateral primary osteoarthritis of hip: Secondary | ICD-10-CM | POA: Diagnosis not present

## 2024-03-20 DIAGNOSIS — M170 Bilateral primary osteoarthritis of knee: Secondary | ICD-10-CM | POA: Diagnosis not present

## 2024-03-20 DIAGNOSIS — M1711 Unilateral primary osteoarthritis, right knee: Secondary | ICD-10-CM | POA: Diagnosis not present

## 2024-03-20 DIAGNOSIS — M1712 Unilateral primary osteoarthritis, left knee: Secondary | ICD-10-CM | POA: Diagnosis not present

## 2024-04-16 ENCOUNTER — Encounter (HOSPITAL_COMMUNITY): Payer: Self-pay | Admitting: *Deleted

## 2024-04-16 DIAGNOSIS — A084 Viral intestinal infection, unspecified: Secondary | ICD-10-CM | POA: Diagnosis not present

## 2024-04-16 DIAGNOSIS — Z1211 Encounter for screening for malignant neoplasm of colon: Secondary | ICD-10-CM | POA: Diagnosis not present

## 2024-04-23 ENCOUNTER — Other Ambulatory Visit: Payer: Self-pay | Admitting: Physician Assistant

## 2024-04-23 DIAGNOSIS — R0789 Other chest pain: Secondary | ICD-10-CM

## 2024-04-27 ENCOUNTER — Ambulatory Visit (HOSPITAL_COMMUNITY)
Admission: RE | Admit: 2024-04-27 | Discharge: 2024-04-27 | Disposition: A | Discharge status: Home / Self Care | Source: Ambulatory Visit | Attending: Physician Assistant | Admitting: Physician Assistant

## 2024-04-27 DIAGNOSIS — R0789 Other chest pain: Secondary | ICD-10-CM | POA: Insufficient documentation

## 2024-04-27 MED ORDER — REGADENOSON 0.4 MG/5ML IV SOLN
INTRAVENOUS | Status: AC
  Filled 2024-04-27: qty 5

## 2024-04-27 MED ORDER — REGADENOSON 0.4 MG/5ML IV SOLN
0.4000 mg | Freq: Once | INTRAVENOUS | Status: AC
  Administered 2024-04-27: 0.4 mg via INTRAVENOUS

## 2024-04-27 MED ORDER — TECHNETIUM TC 99M TETROFOSMIN IV KIT
31.1000 | PACK | Freq: Once | INTRAVENOUS | Status: AC | PRN
  Administered 2024-04-27: 31.1 via INTRAVENOUS

## 2024-04-27 MED ORDER — TECHNETIUM TC 99M TETROFOSMIN IV KIT
10.4000 | PACK | Freq: Once | INTRAVENOUS | Status: AC | PRN
  Administered 2024-04-27: 10.4 via INTRAVENOUS

## 2024-04-28 ENCOUNTER — Ambulatory Visit: Payer: Self-pay | Admitting: Physician Assistant

## 2024-04-28 LAB — MYOCARDIAL PERFUSION IMAGING
LV dias vol: 87 mL (ref 46–106)
LV sys vol: 14 mL (ref 3.8–5.2)
Nuc Stress EF: 84 %
Peak HR: 104 {beats}/min
Rest HR: 96 {beats}/min
Rest Nuclear Isotope Dose: 10.4 mCi
SDS: 10
SRS: 5
SSS: 15
ST Depression (mm): 0 mm
Stress Nuclear Isotope Dose: 31.1 mCi
TID: 0.95

## 2024-05-01 ENCOUNTER — Ambulatory Visit: Payer: Self-pay | Admitting: Physician Assistant

## 2024-05-01 DIAGNOSIS — Z79899 Other long term (current) drug therapy: Secondary | ICD-10-CM

## 2024-05-01 MED ORDER — FUROSEMIDE 20 MG PO TABS: 20.0000 mg | ORAL_TABLET | Freq: Every day | ORAL | 1 refills | Status: AC

## 2024-05-05 ENCOUNTER — Telehealth: Payer: Self-pay | Admitting: Cardiology

## 2024-05-05 DIAGNOSIS — M170 Bilateral primary osteoarthritis of knee: Secondary | ICD-10-CM | POA: Diagnosis not present

## 2024-05-05 DIAGNOSIS — Z1211 Encounter for screening for malignant neoplasm of colon: Secondary | ICD-10-CM | POA: Diagnosis not present

## 2024-05-05 NOTE — Telephone Encounter (Signed)
 Patient called to follow-up on getting orders for echocardiogram test and lab work.

## 2024-05-06 ENCOUNTER — Other Ambulatory Visit: Payer: Self-pay | Admitting: *Deleted

## 2024-05-06 DIAGNOSIS — R0602 Shortness of breath: Secondary | ICD-10-CM

## 2024-05-06 NOTE — Addendum Note (Signed)
 Addended by: BILLY CAMELIA CROME on: 05/06/2024 10:34 AM   Modules accepted: Orders

## 2024-05-11 NOTE — Telephone Encounter (Signed)
 Covering Leslie Taylor's inbox. This came back to me as an overdue result. I think from the 04/28/24 result note from [place] she wanted an initial BMET/BNP then repeat in 2 weeks. I think from notes below patient was just advised of the repeat in 2 weeks. We are a little over a week out, can we go ahead and have her return for these labs at her convenience?

## 2024-05-13 ENCOUNTER — Other Ambulatory Visit: Payer: Self-pay | Admitting: Cardiology

## 2024-05-13 DIAGNOSIS — Z79899 Other long term (current) drug therapy: Secondary | ICD-10-CM | POA: Diagnosis not present

## 2024-05-14 LAB — BASIC METABOLIC PANEL WITH GFR
BUN/Creatinine Ratio: 19 (ref 12–28)
BUN: 17 mg/dL (ref 8–27)
CO2: 23 mmol/L (ref 20–29)
Calcium: 9.4 mg/dL (ref 8.7–10.3)
Chloride: 99 mmol/L (ref 96–106)
Creatinine, Ser: 0.88 mg/dL (ref 0.57–1.00)
Glucose: 140 mg/dL — ABNORMAL HIGH (ref 70–99)
Potassium: 3.8 mmol/L (ref 3.5–5.2)
Sodium: 138 mmol/L (ref 134–144)
eGFR: 71 mL/min/1.73 (ref >59)

## 2024-05-14 LAB — BRAIN NATRIURETIC PEPTIDE: BNP: 21.2 pg/mL (ref 0.0–100.0)

## 2024-05-19 ENCOUNTER — Other Ambulatory Visit: Payer: Self-pay | Admitting: Cardiology

## 2024-05-19 ENCOUNTER — Ambulatory Visit (HOSPITAL_COMMUNITY)
Admission: RE | Admit: 2024-05-19 | Discharge: 2024-05-19 | Disposition: A | Discharge status: Home / Self Care | Source: Ambulatory Visit | Attending: Physician Assistant | Admitting: Physician Assistant

## 2024-05-19 DIAGNOSIS — R0602 Shortness of breath: Secondary | ICD-10-CM | POA: Diagnosis not present

## 2024-05-19 LAB — ECHOCARDIOGRAM COMPLETE
AR max vel: 3.06 cm2
AV Area VTI: 2.74 cm2
AV Area mean vel: 2.77 cm2
AV Mean grad: 3 mmHg
AV Peak grad: 6.6 mmHg
Ao pk vel: 1.28 m/s
Area-P 1/2: 4.63 cm2
S' Lateral: 2.73 cm

## 2024-05-20 ENCOUNTER — Ambulatory Visit: Payer: Self-pay | Admitting: Physician Assistant

## 2024-05-21 ENCOUNTER — Telehealth: Payer: Self-pay | Admitting: Physician Assistant

## 2024-05-21 DIAGNOSIS — R0602 Shortness of breath: Secondary | ICD-10-CM

## 2024-05-21 DIAGNOSIS — J90 Pleural effusion, not elsewhere classified: Secondary | ICD-10-CM

## 2024-05-21 DIAGNOSIS — Z79899 Other long term (current) drug therapy: Secondary | ICD-10-CM

## 2024-05-21 NOTE — Telephone Encounter (Signed)
 I spoke with patient and reviewed echo results with her. She has questions about imaging findings on stress test   Stress test normal. No changes. Has bilateral pleural effusions in her lungs, also atelectasis and septal thickening consistent with edema. Let's check a bmet and BNP. Give her low dose lasix  20 mg once daily and come back in 2 weeks for a repeat bmet and bnp. She also needs an echo for SOB. thanks    Patient  had BMET and BNP done on 9/10.  She has been taking Lasix .  She will come back for BMET and BNP on 9/24.  Patient is asking about plan for pleural effusion and if any additional testing/treatment needed.

## 2024-05-21 NOTE — Telephone Encounter (Signed)
 I spoke with patient and gave her message from Olivia Pavy, [place].  She would like to follow up with PCP.  Will send stress test and echo results to Dr Arloa

## 2024-05-21 NOTE — Telephone Encounter (Signed)
 Patient notified of result.  Please refer to phone note from today for complete details.   Avelina Felix, RN 05/21/2024 1:19 PM

## 2024-05-21 NOTE — Telephone Encounter (Signed)
Pt returning call from yesterday regarding results. Please advise

## 2024-05-21 NOTE — Telephone Encounter (Signed)
 error

## 2024-05-27 DIAGNOSIS — J018 Other acute sinusitis: Secondary | ICD-10-CM | POA: Diagnosis not present

## 2024-05-27 DIAGNOSIS — R5383 Other fatigue: Secondary | ICD-10-CM | POA: Diagnosis not present

## 2024-05-27 DIAGNOSIS — Z03818 Encounter for observation for suspected exposure to other biological agents ruled out: Secondary | ICD-10-CM | POA: Diagnosis not present

## 2024-05-27 DIAGNOSIS — R0602 Shortness of breath: Secondary | ICD-10-CM

## 2024-05-27 DIAGNOSIS — H1033 Unspecified acute conjunctivitis, bilateral: Secondary | ICD-10-CM | POA: Diagnosis not present

## 2024-05-27 DIAGNOSIS — Z79899 Other long term (current) drug therapy: Secondary | ICD-10-CM

## 2024-05-27 DIAGNOSIS — J90 Pleural effusion, not elsewhere classified: Secondary | ICD-10-CM

## 2024-06-02 DIAGNOSIS — L821 Other seborrheic keratosis: Secondary | ICD-10-CM | POA: Diagnosis not present

## 2024-06-02 DIAGNOSIS — L814 Other melanin hyperpigmentation: Secondary | ICD-10-CM | POA: Diagnosis not present

## 2024-06-02 DIAGNOSIS — D485 Neoplasm of uncertain behavior of skin: Secondary | ICD-10-CM | POA: Diagnosis not present

## 2024-06-02 DIAGNOSIS — C44319 Basal cell carcinoma of skin of other parts of face: Secondary | ICD-10-CM | POA: Diagnosis not present

## 2024-06-02 DIAGNOSIS — D1801 Hemangioma of skin and subcutaneous tissue: Secondary | ICD-10-CM | POA: Diagnosis not present

## 2024-06-08 DIAGNOSIS — J90 Pleural effusion, not elsewhere classified: Secondary | ICD-10-CM | POA: Diagnosis not present

## 2024-06-08 DIAGNOSIS — Z79899 Other long term (current) drug therapy: Secondary | ICD-10-CM | POA: Diagnosis not present

## 2024-06-09 ENCOUNTER — Ambulatory Visit: Payer: Self-pay | Admitting: Physician Assistant

## 2024-06-09 LAB — BASIC METABOLIC PANEL WITH GFR
BUN/Creatinine Ratio: 12 (ref 12–28)
BUN: 10 mg/dL (ref 8–27)
CO2: 23 mmol/L (ref 20–29)
Calcium: 9 mg/dL (ref 8.7–10.3)
Chloride: 106 mmol/L (ref 96–106)
Creatinine, Ser: 0.81 mg/dL (ref 0.57–1.00)
Glucose: 84 mg/dL (ref 70–99)
Potassium: 3.8 mmol/L (ref 3.5–5.2)
Sodium: 142 mmol/L (ref 134–144)
eGFR: 79 mL/min/1.73 (ref >59)

## 2024-06-09 LAB — PRO B NATRIURETIC PEPTIDE: NT-Pro BNP: 342 pg/mL — AB (ref 0–301)

## 2024-06-15 DIAGNOSIS — M85851 Other specified disorders of bone density and structure, right thigh: Secondary | ICD-10-CM | POA: Diagnosis not present

## 2024-06-15 DIAGNOSIS — Z1231 Encounter for screening mammogram for malignant neoplasm of breast: Secondary | ICD-10-CM | POA: Diagnosis not present

## 2024-06-15 DIAGNOSIS — M85852 Other specified disorders of bone density and structure, left thigh: Secondary | ICD-10-CM | POA: Diagnosis not present

## 2024-06-29 DIAGNOSIS — C44319 Basal cell carcinoma of skin of other parts of face: Secondary | ICD-10-CM | POA: Diagnosis not present

## 2024-08-03 ENCOUNTER — Telehealth: Payer: Self-pay

## 2024-08-03 NOTE — Telephone Encounter (Signed)
 Left voicemail to see how patient is feeling as she nears 2 year s/p Watch[employer] implant.

## 2024-08-04 NOTE — Telephone Encounter (Signed)
Left 2nd voicemail for patient to return call.

## 2024-08-04 NOTE — Telephone Encounter (Signed)
 Called to check in with patient, who had LAAO on 09/27/2022. The patient reports doing well with no issues.  The patient understands to call with questions or concerns.
